# Patient Record
Sex: Female | Born: 1965
Health system: Southern US, Community
[De-identification: ages and names within clinical notes are randomized; demographics above are authoritative.]

## PROBLEM LIST (undated history)

## (undated) DIAGNOSIS — Z86718 Personal history of other venous thrombosis and embolism: Secondary | ICD-10-CM

## (undated) DIAGNOSIS — M1990 Unspecified osteoarthritis, unspecified site: Secondary | ICD-10-CM

## (undated) DIAGNOSIS — N739 Female pelvic inflammatory disease, unspecified: Secondary | ICD-10-CM

## (undated) DIAGNOSIS — G43909 Migraine, unspecified, not intractable, without status migrainosus: Secondary | ICD-10-CM

## (undated) DIAGNOSIS — F32A Depression, unspecified: Secondary | ICD-10-CM

## (undated) DIAGNOSIS — E785 Hyperlipidemia, unspecified: Secondary | ICD-10-CM

## (undated) DIAGNOSIS — F419 Anxiety disorder, unspecified: Secondary | ICD-10-CM

## (undated) DIAGNOSIS — F329 Major depressive disorder, single episode, unspecified: Secondary | ICD-10-CM

## (undated) DIAGNOSIS — B019 Varicella without complication: Secondary | ICD-10-CM

## (undated) DIAGNOSIS — K219 Gastro-esophageal reflux disease without esophagitis: Secondary | ICD-10-CM

## (undated) DIAGNOSIS — N809 Endometriosis, unspecified: Secondary | ICD-10-CM

## (undated) DIAGNOSIS — E079 Disorder of thyroid, unspecified: Secondary | ICD-10-CM

## (undated) DIAGNOSIS — D219 Benign neoplasm of connective and other soft tissue, unspecified: Secondary | ICD-10-CM

## (undated) DIAGNOSIS — S3992XA Unspecified injury of lower back, initial encounter: Secondary | ICD-10-CM

## (undated) DIAGNOSIS — N979 Female infertility, unspecified: Secondary | ICD-10-CM

## (undated) DIAGNOSIS — I10 Essential (primary) hypertension: Secondary | ICD-10-CM

## (undated) DIAGNOSIS — A64 Unspecified sexually transmitted disease: Secondary | ICD-10-CM

## (undated) DIAGNOSIS — M797 Fibromyalgia: Secondary | ICD-10-CM

## (undated) HISTORY — DX: Unspecified sexually transmitted disease: A64

## (undated) HISTORY — DX: Endometriosis, unspecified: N80.9

## (undated) HISTORY — DX: Depression, unspecified: F32.A

## (undated) HISTORY — DX: Benign neoplasm of connective and other soft tissue, unspecified: D21.9

## (undated) HISTORY — DX: Migraine, unspecified, not intractable, without status migrainosus: G43.909

## (undated) HISTORY — DX: Anxiety disorder, unspecified: F41.9

## (undated) HISTORY — DX: Major depressive disorder, single episode, unspecified: F32.9

## (undated) HISTORY — PX: LAPAROSCOPY: SHX197

## (undated) HISTORY — DX: Personal history of other venous thrombosis and embolism: Z86.718

## (undated) HISTORY — DX: Female pelvic inflammatory disease, unspecified: N73.9

## (undated) HISTORY — DX: Gastro-esophageal reflux disease without esophagitis: K21.9

## (undated) HISTORY — PX: OVARIAN CYST SURGERY: SHX726

## (undated) HISTORY — DX: Female infertility, unspecified: N97.9

## (undated) HISTORY — DX: Hyperlipidemia, unspecified: E78.5

## (undated) HISTORY — DX: Varicella without complication: B01.9

---

## 2014-08-30 ENCOUNTER — Ambulatory Visit: Payer: Self-pay | Attending: Family Medicine

## 2014-09-17 ENCOUNTER — Encounter (HOSPITAL_COMMUNITY): Payer: Self-pay | Admitting: Emergency Medicine

## 2014-09-17 ENCOUNTER — Emergency Department (INDEPENDENT_AMBULATORY_CARE_PROVIDER_SITE_OTHER)
Admission: EM | Admit: 2014-09-17 | Discharge: 2014-09-17 | Disposition: A | Discharge status: Home / Self Care | Payer: Self-pay | Source: Home / Self Care | Attending: Family Medicine | Admitting: Family Medicine

## 2014-09-17 DIAGNOSIS — E038 Other specified hypothyroidism: Secondary | ICD-10-CM

## 2014-09-17 HISTORY — DX: Essential (primary) hypertension: I10

## 2014-09-17 HISTORY — DX: Unspecified injury of lower back, initial encounter: S39.92XA

## 2014-09-17 HISTORY — DX: Disorder of thyroid, unspecified: E07.9

## 2014-09-17 MED ORDER — LEVOTHYROXINE SODIUM 112 MCG PO TABS: 112.0000 ug | ORAL_TABLET | Freq: Every day | ORAL | Status: DC

## 2014-09-17 NOTE — Discharge Instructions (Signed)
Hypothyroidism The thyroid is a large gland located in the lower front of your neck. The thyroid gland helps control metabolism. Metabolism is how your body handles food. It controls metabolism with the hormone thyroxine. When this gland is underactive (hypothyroid), it produces too little hormone.  CAUSES These include:   Absence or destruction of thyroid tissue.  Goiter due to iodine deficiency.  Goiter due to medications.  Congenital defects (since birth).  Problems with the pituitary. This causes a lack of TSH (thyroid stimulating hormone). This hormone tells the thyroid to turn out more hormone. SYMPTOMS  Lethargy (feeling as though you have no energy)  Cold intolerance  Weight gain (in spite of normal food intake)  Dry skin  Coarse hair  Menstrual irregularity (if severe, may lead to infertility)  Slowing of thought processes Cardiac problems are also caused by insufficient amounts of thyroid hormone. Hypothyroidism in the newborn is cretinism, and is an extreme form. It is important that this form be treated adequately and immediately or it will lead rapidly to retarded physical and mental development. DIAGNOSIS  To prove hypothyroidism, your caregiver may do blood tests and ultrasound tests. Sometimes the signs are hidden. It may be necessary for your caregiver to watch this illness with blood tests either before or after diagnosis and treatment. TREATMENT  Low levels of thyroid hormone are increased by using synthetic thyroid hormone. This is a safe, effective treatment. It usually takes about four weeks to gain the full effects of the medication. After you have the full effect of the medication, it will generally take another four weeks for problems to leave. Your caregiver may start you on low doses. If you have had heart problems the dose may be gradually increased. It is generally not an emergency to get rapidly to normal. HOME CARE INSTRUCTIONS   Take your  medications as your caregiver suggests. Let your caregiver know of any medications you are taking or start taking. Your caregiver will help you with dosage schedules.  As your condition improves, your dosage needs may increase. It will be necessary to have continuing blood tests as suggested by your caregiver.  Report all suspected medication side effects to your caregiver. SEEK MEDICAL CARE IF: Seek medical care if you develop:  Sweating.  Tremulousness (tremors).  Anxiety.  Rapid weight loss.  Heat intolerance.  Emotional swings.  Diarrhea.  Weakness. SEEK IMMEDIATE MEDICAL CARE IF:  You develop chest pain, an irregular heart beat (palpitations), or a rapid heart beat. MAKE SURE YOU:   Understand these instructions.  Will watch your condition.  Will get help right away if you are not doing well or get worse. Document Released: 10/29/2005 Document Revised: 01/21/2012 Document Reviewed: 06/18/2008 [employer] Patient Information 2015 [employer]. This information is not intended to replace advice given to you by your health care provider. Make sure you discuss any questions you have with your health care provider.  

## 2014-09-17 NOTE — ED Notes (Signed)
Pt states she is in process with [facility] to be set up with a PCP.  She is currently out of her thyroid medication and she is suffering from fatigue and generalized pain.  Pt wants to get a refill on her medication until she is set up with a PCP.

## 2014-09-17 NOTE — ED Provider Notes (Signed)
CSN: [id number]     Arrival date & time 09/17/14  1500 History   First MD Initiated Contact with Patient 09/17/14 1526     No chief complaint on file.  (Consider location/radiation/quality/duration/timing/severity/associated sxs/prior Treatment) HPI       48 year old female presents requesting a refill of her levothyroxine. She has been taking 112 g daily for the past 3. She is currently between primary care providers. She has not had her TSH levels checked over one year. She is having symptoms of fatigue and headaches since she ran out of her medication completely 6 days ago. She is in the process of setting up primary care provider through [facility] but she has not finished this process. She is requesting a refill to last her until she gets primary care set up.  No past medical history on file. No past surgical history on file. No family history on file. History  Substance Use Topics  . Smoking status: Not on file  . Smokeless tobacco: Not on file  . Alcohol Use: Not on file   OB History    No data available     Review of Systems  Constitutional: Positive for fatigue.  Neurological: Positive for headaches.  All other systems reviewed and are negative.   Allergies  Review of patient's allergies indicates not on file.  Home Medications   Prior to Admission medications   Medication Sig Start Date End Date Taking? Authorizing Provider  levothyroxine (SYNTHROID) 112 MCG tablet Take 1 tablet (112 mcg total) by mouth daily before breakfast. 09/17/14   Liam Graham, PA-C   BP 156/85 mmHg  Pulse 92  Temp(Src) 99.3 F (37.4 C) (Oral)  Resp 16  SpO2 98% Physical Exam  Constitutional: She is oriented to person, place, and time. Vital signs are normal. She appears well-developed and well-nourished. No distress.  HENT:  Head: Normocephalic and atraumatic.  Neck: Normal range of motion. Neck supple. No thyromegaly present.  Cardiovascular: Normal rate, regular rhythm, normal  heart sounds and intact distal pulses.   Pulmonary/Chest: Effort normal and breath sounds normal. No respiratory distress. She has no wheezes. She has no rales.  Neurological: She is alert and oriented to person, place, and time. She has normal strength. Coordination normal.  Skin: Skin is warm and dry. No rash noted. She is not diaphoretic.  Psychiatric: She has a normal mood and affect. Judgment normal.  Nursing note and vitals reviewed.   ED Course  Procedures (including critical care time) Labs Review Labs Reviewed - No data to display  Imaging Review No results found.   MDM   1. Other specified hypothyroidism    Levothyroxine refilled, #30 with 1 refill. She will follow-up with primary care. If she does not have an appointment yet, she will follow-up here in 6 weeks to get her TSH levels checked.  Meds ordered this encounter  Medications  . levothyroxine (SYNTHROID) 112 MCG tablet    Sig: Take 1 tablet (112 mcg total) by mouth daily before breakfast.    Dispense:  30 tablet    Refill:  1    Order Specific Question:  Supervising Provider    Answer:  Ihor Gully D [[id number]]       Liam Graham, PA-C 09/17/14 1534

## 2014-10-20 ENCOUNTER — Encounter: Payer: Self-pay | Admitting: Internal Medicine

## 2014-10-20 ENCOUNTER — Ambulatory Visit: Payer: Self-pay | Attending: Internal Medicine | Admitting: Internal Medicine

## 2014-10-20 VITALS — BP 171/95 | HR 85 | Temp 98.5°F | Resp 16 | Ht 60.0 in | Wt 125.0 lb

## 2014-10-20 DIAGNOSIS — I517 Cardiomegaly: Secondary | ICD-10-CM | POA: Insufficient documentation

## 2014-10-20 DIAGNOSIS — F17210 Nicotine dependence, cigarettes, uncomplicated: Secondary | ICD-10-CM | POA: Insufficient documentation

## 2014-10-20 DIAGNOSIS — Z720 Tobacco use: Secondary | ICD-10-CM

## 2014-10-20 DIAGNOSIS — F419 Anxiety disorder, unspecified: Secondary | ICD-10-CM | POA: Insufficient documentation

## 2014-10-20 DIAGNOSIS — F17200 Nicotine dependence, unspecified, uncomplicated: Secondary | ICD-10-CM

## 2014-10-20 DIAGNOSIS — Z0000 Encounter for general adult medical examination without abnormal findings: Secondary | ICD-10-CM | POA: Insufficient documentation

## 2014-10-20 DIAGNOSIS — E079 Disorder of thyroid, unspecified: Secondary | ICD-10-CM | POA: Insufficient documentation

## 2014-10-20 DIAGNOSIS — I10 Essential (primary) hypertension: Secondary | ICD-10-CM | POA: Insufficient documentation

## 2014-10-20 LAB — CBC WITH DIFFERENTIAL/PLATELET
Basophils Absolute: 0 10*3/uL (ref 0.0–0.1)
Basophils Relative: 0 % (ref 0–1)
EOS ABS: 0.1 10*3/uL (ref 0.0–0.7)
Eosinophils Relative: 1 % (ref 0–5)
HCT: 42.5 % (ref 36.0–46.0)
Hemoglobin: 14.5 g/dL (ref 12.0–15.0)
Lymphocytes Relative: 30 % (ref 12–46)
Lymphs Abs: 2.4 10*3/uL (ref 0.7–4.0)
MCH: 30 pg (ref 26.0–34.0)
MCHC: 34.1 g/dL (ref 30.0–36.0)
MCV: 87.8 fL (ref 78.0–100.0)
MONO ABS: 0.7 10*3/uL (ref 0.1–1.0)
MONOS PCT: 9 % (ref 3–12)
MPV: 12.2 fL (ref 9.4–12.4)
NEUTROS PCT: 60 % (ref 43–77)
Neutro Abs: 4.7 10*3/uL (ref 1.7–7.7)
Platelets: 221 10*3/uL (ref 150–400)
RBC: 4.84 MIL/uL (ref 3.87–5.11)
RDW: 13.6 % (ref 11.5–15.5)
WBC: 7.9 10*3/uL (ref 4.0–10.5)

## 2014-10-20 MED ORDER — LISINOPRIL 10 MG PO TABS: 10.0000 mg | ORAL_TABLET | Freq: Every day | ORAL | Status: DC

## 2014-10-20 NOTE — Progress Notes (Signed)
Pt is here to establish care. Pt has a history of HTN and thyroid disease.  Pt is requesting a physical and a pap smear. Pt states that her joints are swelling. Pt gets congested and is having allergy's. Pt has anxiety and states that sometimes it feels like her heart want to jump out of her chest.

## 2014-10-20 NOTE — Progress Notes (Signed)
Patient ID: [name], female   DOB: 01/19/1966, 48 y.o.   MRN: [id number]  MOQ:[id number]  [id number]:[id number]  DOB - [birth year]  CC:  Chief Complaint  Patient presents with  . Establish Care       HPI: Cynthia Wolf is a 48 y.o. female here today to establish medical care.  She has a past medical history of hypertension, thyroid disease, and tobacco use.  She states that she has had HTN since childhood which caused cardiomegaly. She does not remember the name of the medications that she has been on in the past.  Patient reports that she has been off her medications for several months.  She reports anxiety and joint pain that have become worse over the past few months.  She states that she often has swelling of her joints that limit her ROM.  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    She would like to have a full physical today including pap.  Her last pap and mammogram were 3 years ago and they were both normal.  She denies any breast changes, mass, tenderness, or nipple discharge.  She denies vaginal discharge, itch, or lesions.  She does not some vaginal odor.   Medications synthroid 112 mcg Surgical hx: "surgical adhesion removal of uterus" Social hx: 0.5 ppd smoker, caffeine intake (1-2 pots of coffee per day), rare etoh use, denies drug use.  Hx of chlamydia twice.    Allergies  Allergen Reactions  . Shellfish Allergy   . Yellow Dyes (Non-Tartrazine)    Past Medical History  Diagnosis Date  . Hypertension   . Thyroid disease   . Back injury    Current Outpatient Prescriptions on File Prior to Visit  Medication Sig Dispense Refill  . levothyroxine (SYNTHROID) 112 MCG tablet Take 1 tablet (112 mcg total) by mouth daily before breakfast. 30 tablet 1  . levothyroxine (SYNTHROID, LEVOTHROID) 112 MCG tablet Take 112 mcg by mouth daily before breakfast.     No current facility-administered medications on file prior to visit.   History  reviewed. No pertinent family history. History   Social History  . Marital Status: Unknown    Spouse Name: N/A    Number of Children: N/A  . Years of Education: N/A   Occupational History  . Not on file.   Social History Main Topics  . Smoking status: Current Every Day Smoker -- 0.50 packs/day  . Smokeless tobacco: Never Used  . Alcohol Use: Yes     Comment: socially  . Drug Use: No  . Sexual Activity: Not on file   Other Topics Concern  . Not on file   Social History Narrative    Review of Systems  Constitutional: Negative.   Eyes: Negative.   Respiratory: Positive for wheezing. Negative for cough, sputum production and shortness of breath.   Cardiovascular: Positive for palpitations (increased caffeine intake). Negative for chest pain, claudication and leg swelling.  Gastrointestinal: Positive for abdominal pain (pulling sensation in lower abdomen). Negative for heartburn, nausea, diarrhea and constipation.  Genitourinary: Negative.   Musculoskeletal: Negative.   Skin: Negative.   Neurological: Positive for headaches. Negative for dizziness and tingling.  Psychiatric/Behavioral: The patient is not nervous/anxious.       Objective:   Filed Vitals:   10/20/14 1507  BP: 171/95  Pulse: 85  Temp: 98.5 F (36.9 C)  Resp: 16    Physical Exam: Constitutional: Patient appears well-developed and well-nourished. No distress.  HENT: Normocephalic, atraumatic, External right and left ear normal. Oropharynx is clear and moist.  Eyes: Conjunctivae and EOM are normal. PERRLA, no scleral icterus. Neck: Normal ROM. Neck supple. No JVD. No tracheal deviation. No thyromegaly. CVS: RRR, S1/S2 +, no murmurs, no gallops, no carotid bruit.  Pulmonary: Effort and breath sounds normal, no stridor, rhonchi, wheezes, rales.  Abdominal: Soft. BS +, no distension, tenderness, rebound or guarding.  Musculoskeletal: Normal range of motion. No edema and no tenderness.  Lymphadenopathy:  No lymphadenopathy noted, cervical, Neuro: Alert. Normal reflexes, muscle tone coordination. No cranial nerve deficit. Skin: Skin is warm and dry. No rash noted. Not diaphoretic. No erythema. No pallor. Psychiatric: Normal mood and affect. Behavior, judgment, thought content normal.  Physical Exam  Pulmonary/Chest: Right breast exhibits no inverted nipple, no mass, no nipple discharge and no tenderness. Left breast exhibits no inverted nipple, no mass, no nipple discharge and no tenderness.  Genitourinary: Rectum normal, vagina normal and uterus normal. Cervix exhibits no motion tenderness, no discharge and no friability. Right adnexum displays no tenderness. Left adnexum displays no tenderness.  Lymphadenopathy:       Right: No inguinal adenopathy present.       Left: No inguinal adenopathy present.     No results found for: WBC, HGB, HCT, MCV, PLT No results found for: CREATININE, BUN, NA, K, CL, CO2  No results found for: HGBA1C Lipid Panel  No results found for: CHOL, TRIG, HDL, CHOLHDL, VLDL, LDLCALC     Assessment and plan:   Terricka was seen today for establish care.  Diagnoses and associated orders for this visit:  Annual physical exam - Cytology - PAP [place] - Cervicovaginal ancillary only - HIV antibody (with reflex) - RPR - CBC with Differential - COMPLETE METABOLIC PANEL WITH GFR - TSH - T4, Free - Lipid panel  Essential hypertension - Began lisinopril (PRINIVIL,ZESTRIL) 10 MG tablet; Take 1 tablet (10 mg total) by mouth daily.  Explained to patient diet modifications that may contribute to elevated BP. Patient will avoid foods that are high in sodium such as  canned soups and vegetables, tomato juice, commercial baked goods, commercially prepared frozen or canned entrees and sauces. Avoid salty snacks, added salt when cooking, and substituting for low sodium herbs or spices Explained exercise regimen of cardio at least three times weekly to help lower BP and  cholesterol  Smoking Smoking cessation discussed for 3 minutes, patient is not willing to quit at this time. Will continue to assess on each visit. Discussed increased risk for diseases such as cancer, heart disease, and stroke.     Return for 2-3 weeks , Nurse Visit-BP check.      Chari Manning, NP-C [facility] [phone] 10/20/2014, 3:19 PM

## 2014-10-20 NOTE — Patient Instructions (Signed)
Smoking Cessation Quitting smoking is important to your health and has many advantages. However, it is not always easy to quit since nicotine is a very addictive drug. Oftentimes, people try 3 times or more before being able to quit. This document explains the best ways for you to prepare to quit smoking. Quitting takes hard work and a lot of effort, but you can do it. ADVANTAGES OF QUITTING SMOKING  You will live longer, feel better, and live better.  Your body will feel the impact of quitting smoking almost immediately.  Within 20 minutes, blood pressure decreases. Your pulse returns to its normal level.  After 8 hours, carbon monoxide levels in the blood return to normal. Your oxygen level increases.  After 24 hours, the chance of having a heart attack starts to decrease. Your breath, hair, and body stop smelling like smoke.  After 48 hours, damaged nerve endings begin to recover. Your sense of taste and smell improve.  After 72 hours, the body is virtually free of nicotine. Your bronchial tubes relax and breathing becomes easier.  After 2 to 12 weeks, lungs can hold more air. Exercise becomes easier and circulation improves.  The risk of having a heart attack, stroke, cancer, or lung disease is greatly reduced.  After 1 year, the risk of coronary heart disease is cut in half.  After 5 years, the risk of stroke falls to the same as a nonsmoker.  After 10 years, the risk of lung cancer is cut in half and the risk of other cancers decreases significantly.  After 15 years, the risk of coronary heart disease drops, usually to the level of a nonsmoker.  If you are pregnant, quitting smoking will improve your chances of having a healthy baby.  The people you live with, especially any children, will be healthier.  You will have extra money to spend on things other than cigarettes. QUESTIONS TO THINK ABOUT BEFORE ATTEMPTING TO QUIT You may want to talk about your answers with your  health care provider.  Why do you want to quit?  If you tried to quit in the past, what helped and what did not?  What will be the most difficult situations for you after you quit? How will you plan to handle them?  Who can help you through the tough times? Your family? Friends? A health care provider?  What pleasures do you get from smoking? What ways can you still get pleasure if you quit? Here are some questions to ask your health care provider:  How can you help me to be successful at quitting?  What medicine do you think would be best for me and how should I take it?  What should I do if I need more help?  What is smoking withdrawal like? How can I get information on withdrawal? GET READY  Set a quit date.  Change your environment by getting rid of all cigarettes, ashtrays, matches, and lighters in your home, car, or work. Do not let people smoke in your home.  Review your past attempts to quit. Think about what worked and what did not. GET SUPPORT AND ENCOURAGEMENT You have a better chance of being successful if you have help. You can get support in many ways.  Tell your family, friends, and coworkers that you are going to quit and need their support. Ask them not to smoke around you.  Get individual, group, or telephone counseling and support. Programs are available at local hospitals and health centers. Call   your local health department for information about programs in your area.  Spiritual beliefs and practices may help some smokers quit.  Download a "quit meter" on your computer to keep track of quit statistics, such as how long you have gone without smoking, cigarettes not smoked, and money saved.  Get a self-help book about quitting smoking and staying off tobacco. [place] yourself from urges to smoke. Talk to someone, go for a walk, or occupy your time with a task.  Change your normal routine. Take a different route to work.  Drink tea instead of coffee. Eat breakfast in a different place.  Reduce your stress. Take a hot bath, exercise, or read a book.  Plan something enjoyable to do every day. Reward yourself for not smoking.  Explore interactive web-based programs that specialize in helping you quit. GET MEDICINE AND USE IT CORRECTLY Medicines can help you stop smoking and decrease the urge to smoke. Combining medicine with the above behavioral methods and support can greatly increase your chances of successfully quitting smoking.  Nicotine replacement therapy helps deliver nicotine to your body without the negative effects and risks of smoking. Nicotine replacement therapy includes nicotine gum, lozenges, inhalers, nasal sprays, and skin patches. Some may be available over-the-counter and others require a prescription.  Antidepressant medicine helps people abstain from smoking, but how this works is unknown. This medicine is available by prescription.  Nicotinic receptor partial agonist medicine simulates the effect of nicotine in your brain. This medicine is available by prescription. Ask your health care provider for advice about which medicines to use and how to use them based on your health history. Your health care provider will tell you what side effects to look out for if you choose to be on a medicine or therapy. Carefully read the information on the package. Do not use any other product containing nicotine while using a nicotine replacement product.  RELAPSE OR DIFFICULT SITUATIONS Most relapses occur within the first 3 months after quitting. Do not be discouraged if you start smoking again. Remember, most people try several times before finally quitting. You may have symptoms of withdrawal because your body is used to nicotine. You may crave cigarettes, be irritable, feel very hungry, cough often, get headaches, or have difficulty concentrating. The withdrawal symptoms are only temporary. They are strongest  when you first quit, but they will go away within 10-14 days. To reduce the chances of relapse, try to:  Avoid drinking alcohol. Drinking lowers your chances of successfully quitting.  Reduce the amount of caffeine you consume. Once you quit smoking, the amount of caffeine in your body increases and can give you symptoms, such as a rapid heartbeat, sweating, and anxiety.  Avoid smokers because they can make you want to smoke.  Do not let weight gain distract you. Many smokers will gain weight when they quit, usually less than 10 pounds. Eat a healthy diet and stay active. You can always lose the weight gained after you quit.  Find ways to improve your mood other than smoking. FOR MORE INFORMATION  www.smokefree.gov  Document Released: 10/23/2001 Document Revised: 03/15/2014 Document Reviewed: 02/07/2012 [facility] Patient Information 2015 [place], [place]. This information is not intended to replace advice given to you by your health care provider. Make sure you discuss any questions you have with your health care provider.  Hypothyroidism The thyroid is a large gland located in the lower front of your neck. The thyroid gland helps control metabolism.  Metabolism is how your body handles food. It controls metabolism with the hormone thyroxine. When this gland is underactive (hypothyroid), it produces too little hormone.  CAUSES These include:   Absence or destruction of thyroid tissue.  Goiter due to iodine deficiency.  Goiter due to medications.  Congenital defects (since birth).  Problems with the pituitary. This causes a lack of TSH (thyroid stimulating hormone). This hormone tells the thyroid to turn out more hormone. SYMPTOMS  Lethargy (feeling as though you have no energy)  Cold intolerance  Weight gain (in spite of normal food intake)  Dry skin  Coarse hair  Menstrual irregularity (if severe, may lead to infertility)  Slowing of thought processes Cardiac problems are  also caused by insufficient amounts of thyroid hormone. Hypothyroidism in the newborn is cretinism, and is an extreme form. It is important that this form be treated adequately and immediately or it will lead rapidly to retarded physical and mental development. DIAGNOSIS  To prove hypothyroidism, your caregiver may do blood tests and ultrasound tests. Sometimes the signs are hidden. It may be necessary for your caregiver to watch this illness with blood tests either before or after diagnosis and treatment. TREATMENT  Low levels of thyroid hormone are increased by using synthetic thyroid hormone. This is a safe, effective treatment. It usually takes about four weeks to gain the full effects of the medication. After you have the full effect of the medication, it will generally take another four weeks for problems to leave. Your caregiver may start you on low doses. If you have had heart problems the dose may be gradually increased. It is generally not an emergency to get rapidly to normal. HOME CARE INSTRUCTIONS   Take your medications as your caregiver suggests. Let your caregiver know of any medications you are taking or start taking. Your caregiver will help you with dosage schedules.  As your condition improves, your dosage needs may increase. It will be necessary to have continuing blood tests as suggested by your caregiver.  Report all suspected medication side effects to your caregiver. SEEK MEDICAL CARE IF: Seek medical care if you develop:  Sweating.  Tremulousness (tremors).  Anxiety.  Rapid weight loss.  Heat intolerance.  Emotional swings.  Diarrhea.  Weakness. SEEK IMMEDIATE MEDICAL CARE IF:  You develop chest pain, an irregular heart beat (palpitations), or a rapid heart beat. MAKE SURE YOU:   Understand these instructions.  Will watch your condition.  Will get help right away if you are not doing well or get worse. Document Released: 10/29/2005 Document Revised:  01/21/2012 Document Reviewed: 06/18/2008 [facility] Patient Information 2015 [place], [place]. This information is not intended to replace advice given to you by your health care provider. Make sure you discuss any questions you have with your health care provider.

## 2014-10-21 LAB — CYTOLOGY - PAP

## 2014-10-21 LAB — COMPLETE METABOLIC PANEL WITH GFR
ALT: 12 U/L (ref 0–35)
AST: 15 U/L (ref 0–37)
Albumin: 4.4 g/dL (ref 3.5–5.2)
Alkaline Phosphatase: 53 U/L (ref 39–117)
BILIRUBIN TOTAL: 0.6 mg/dL (ref 0.2–1.2)
BUN: 8 mg/dL (ref 6–23)
CALCIUM: 9.4 mg/dL (ref 8.4–10.5)
CO2: 24 mEq/L (ref 19–32)
CREATININE: 0.59 mg/dL (ref 0.50–1.10)
Chloride: 103 mEq/L (ref 96–112)
GFR, Est African American: >89 mL/min
GFR, Est Non African American: >89 mL/min
Glucose, Bld: 84 mg/dL (ref 70–99)
Potassium: 3.9 mEq/L (ref 3.5–5.3)
Sodium: 140 mEq/L (ref 135–145)
Total Protein: 7.7 g/dL (ref 6.0–8.3)

## 2014-10-21 LAB — CERVICOVAGINAL ANCILLARY ONLY
Chlamydia: NEGATIVE
Neisseria Gonorrhea: NEGATIVE
WET PREP (BD AFFIRM): NEGATIVE
Wet Prep (BD Affirm): NEGATIVE
Wet Prep (BD Affirm): POSITIVE — AB

## 2014-10-21 LAB — LIPID PANEL
CHOL/HDL RATIO: 3.1 ratio
Cholesterol: 207 mg/dL — ABNORMAL HIGH (ref 0–200)
HDL: 66 mg/dL (ref >39)
LDL CALC: 120 mg/dL — AB (ref 0–99)
Triglycerides: 104 mg/dL (ref <150)
VLDL: 21 mg/dL (ref 0–40)

## 2014-10-21 LAB — HIV ANTIBODY (ROUTINE TESTING W REFLEX): HIV: NONREACTIVE

## 2014-10-21 LAB — T4, FREE: Free T4: 1.46 ng/dL (ref 0.80–1.80)

## 2014-10-21 LAB — TSH: TSH: 1.195 u[IU]/mL (ref 0.350–4.500)

## 2014-10-21 LAB — RPR

## 2014-10-22 ENCOUNTER — Telehealth: Payer: Self-pay | Admitting: Emergency Medicine

## 2014-10-22 MED ORDER — METRONIDAZOLE 500 MG PO TABS: 500.0000 mg | ORAL_TABLET | Freq: Two times a day (BID) | ORAL | Status: DC

## 2014-10-22 MED ORDER — LEVOTHYROXINE SODIUM 112 MCG PO TABS: 112.0000 ug | ORAL_TABLET | Freq: Every day | ORAL | Status: DC

## 2014-10-22 NOTE — Telephone Encounter (Signed)
-----   Message from Lance Bosch, NP sent at 10/21/2014  6:19 PM EST ----- Labs are normal with the exception of Cholesterol slightly elevated. Please provide appropriate education regarding diet and exercise.   TSH is WNL, please refill current dose of Synthroid.

## 2014-10-22 NOTE — Telephone Encounter (Signed)
2nd attempt to reach pt with pap smear results Medication e-scribed to [facility]

## 2014-10-22 NOTE — Telephone Encounter (Signed)
Left message for pt to call for lab results 

## 2014-10-25 ENCOUNTER — Telehealth: Payer: Self-pay | Admitting: *Deleted

## 2014-10-25 NOTE — Telephone Encounter (Signed)
-----   Message from Lance Bosch, NP sent at 10/21/2014  6:19 PM EST ----- Labs are normal with the exception of Cholesterol slightly elevated. Please provide appropriate education regarding diet and exercise.   TSH is WNL, please refill current dose of Synthroid.

## 2014-10-25 NOTE — Telephone Encounter (Signed)
Patient notified of lab results and instructions Rx previously e-scribed to [facility]

## 2014-10-27 ENCOUNTER — Telehealth: Payer: Self-pay | Admitting: Emergency Medicine

## 2014-10-27 NOTE — Telephone Encounter (Signed)
Left message with negative pap smear results

## 2014-10-27 NOTE — Telephone Encounter (Signed)
-----   Message from Lance Bosch, NP sent at 10/26/2014  1:14 PM EST ----- Pap negative for malignancies

## 2015-04-01 ENCOUNTER — Ambulatory Visit: Payer: Self-pay | Attending: Internal Medicine | Admitting: Family Medicine

## 2015-04-01 VITALS — BP 144/89 | HR 94 | Wt 130.8 lb

## 2015-04-01 DIAGNOSIS — M791 Myalgia, unspecified site: Secondary | ICD-10-CM

## 2015-04-01 DIAGNOSIS — R5383 Other fatigue: Secondary | ICD-10-CM

## 2015-04-01 NOTE — Progress Notes (Signed)
Subjective:     Patient ID: Cynthia Wolf, female   DOB: 1966-09-26, 49 y.o.   MRN: [id number]  HPI   Patient presents today with a complaint of her facial muscles on the right feeling like they are pulling. She has also experienced occassional short episodes of light-headedness upon rising from lying or sitting. This last for a few seconds. Her other complaint is of fatigue for a few days. She has a history of HTN, Hypothyroidism, Fibromyalgia and osetoarthritis. Her meds can be seen in her med list. She denies needing refills. She reports taking her medications regularly. She has recently been in [place] for a couple of months and got a couple of months refills there. She is on some medications for arthritis and fibro but she did not bring those and cannot tell be what they are.   Review of Systems  She denies facial pain, problems with speech or swallowing, Denies any unilateral weakness Denies SOB or chest pain.She denies severe headaches.      Objective:   Physical Exam   She is alert, oriented, appropriate in no distress. She appears well-nourished and well-developed.  Pupils are equal, EOMS intact, There is no facial droop. Smile is even and tongue movement normal. The rest of cranial nerves are intact. Gait is normal. Grips are equal but a little weak due to her OA. Neck is supple FROM w/o adenopathy or tenderness Lungs are clear Heart Sounds are regular. BP 144/89     Assessment:     Abnormal feeling in face on right Fatigue      Plan:     I have explained that I see nothing to indicate a stroke, Bells's Palsey or Trig Neuralgia.   I have made her promise to go to ED immediately for any additional symptoms and spelled out those instruction in her AVS. I suggested labs to see if this suggest any reason for these symptoms or her fatigue but she declines to have done. I have suggested she get her [facility] Card renewed so we can ordered further assessment if  needed She should follo-up here if symptoms do not resolve   Micheline Chapman, FNP-BC

## 2015-04-01 NOTE — Patient Instructions (Signed)
I am not sure why your face feels like it does. You have nothing in your exam to suggest a stroke or TIA If you should have any worsening of symptoms or new symptoms such as weakness on one side of body, difficulty with speech or swallowing. Inability to walk, go to the ED immediately. We are doing bloodwork to see if we find a reason for fatigue. Follow-up with financial people about getting your [facility] card renewed.

## 2015-08-04 ENCOUNTER — Encounter (HOSPITAL_BASED_OUTPATIENT_CLINIC_OR_DEPARTMENT_OTHER): Payer: Self-pay

## 2015-08-04 ENCOUNTER — Emergency Department (HOSPITAL_BASED_OUTPATIENT_CLINIC_OR_DEPARTMENT_OTHER)
Admission: EM | Admit: 2015-08-04 | Discharge: 2015-08-04 | Disposition: A | Discharge status: Home / Self Care | Payer: Self-pay | Attending: Emergency Medicine | Admitting: Emergency Medicine

## 2015-08-04 DIAGNOSIS — E079 Disorder of thyroid, unspecified: Secondary | ICD-10-CM | POA: Insufficient documentation

## 2015-08-04 DIAGNOSIS — I10 Essential (primary) hypertension: Secondary | ICD-10-CM | POA: Insufficient documentation

## 2015-08-04 DIAGNOSIS — Z720 Tobacco use: Secondary | ICD-10-CM | POA: Insufficient documentation

## 2015-08-04 DIAGNOSIS — M545 Low back pain, unspecified: Secondary | ICD-10-CM

## 2015-08-04 DIAGNOSIS — Z87828 Personal history of other (healed) physical injury and trauma: Secondary | ICD-10-CM | POA: Insufficient documentation

## 2015-08-04 HISTORY — DX: Fibromyalgia: M79.7

## 2015-08-04 HISTORY — DX: Unspecified osteoarthritis, unspecified site: M19.90

## 2015-08-04 LAB — URINALYSIS, ROUTINE W REFLEX MICROSCOPIC
BILIRUBIN URINE: NEGATIVE
GLUCOSE, UA: NEGATIVE mg/dL
KETONES UR: NEGATIVE mg/dL
LEUKOCYTES UA: NEGATIVE
NITRITE: NEGATIVE
Protein, ur: NEGATIVE mg/dL
SPECIFIC GRAVITY, URINE: 1.002 — AB (ref 1.005–1.030)
UROBILINOGEN UA: 0.2 mg/dL (ref 0.0–1.0)
pH: 6 (ref 5.0–8.0)

## 2015-08-04 LAB — URINE MICROSCOPIC-ADD ON

## 2015-08-04 MED ORDER — NAPROXEN 500 MG PO TABS: 500.0000 mg | ORAL_TABLET | Freq: Two times a day (BID) | ORAL | Status: DC

## 2015-08-04 MED ORDER — DIAZEPAM 5 MG PO TABS
5.0000 mg | ORAL_TABLET | Freq: Once | ORAL | Status: AC
  Administered 2015-08-04: 5 mg via ORAL
  Filled 2015-08-04: qty 1

## 2015-08-04 MED ORDER — METAXALONE 800 MG PO TABS: 800.0000 mg | ORAL_TABLET | Freq: Three times a day (TID) | ORAL | Status: DC

## 2015-08-04 MED ORDER — OXYCODONE-ACETAMINOPHEN 5-325 MG PO TABS
1.0000 | ORAL_TABLET | Freq: Once | ORAL | Status: AC
  Administered 2015-08-04: 1 via ORAL
  Filled 2015-08-04: qty 1

## 2015-08-04 NOTE — Discharge Instructions (Signed)
Return to the ED with any concerns including weakness of legs, not able to urinate, loss of control of bowel or bladder, fever/chills, decreased level of alertness/lethargy, or any other alarming symptoms °

## 2015-08-04 NOTE — ED Provider Notes (Signed)
CSN: [id number]     Arrival date & time 08/04/15  4431 History   First MD Initiated Contact with Patient 08/04/15 0900     Chief Complaint  Patient presents with  . Back Pain     (Consider location/radiation/quality/duration/timing/severity/associated sxs/prior Treatment) HPI  Pt presenting with right sided low back pain.  No weakness of legs, no incontinence of bowel or bladder, no urinary retention.  No specific injury.  No fever/chills.   Pain began several days ago in the morning.  She states pain radiates down right thigh and to right calf.  No swelling of legs.  Pt is able to ambulate normally.  She has been taking alleve.  There are no other associated systemic symptoms, there are no other alleviating or modifying factors.   Past Medical History  Diagnosis Date  . Hypertension   . Thyroid disease   . Back injury   . Fibromyalgia   . Osteoarthritis    Past Surgical History  Procedure Laterality Date  . Ovarian cyst surgery     No family history on file. Social History  Substance Use Topics  . Smoking status: Current Every Day Smoker -- 0.50 packs/day    Types: Cigarettes  . Smokeless tobacco: Never Used  . Alcohol Use: Yes     Comment: socially   OB History    No data available     Review of Systems  ROS reviewed and all otherwise negative except for mentioned in HPI    Allergies  Shellfish allergy and Yellow dyes (non-tartrazine)  Home Medications   Prior to Admission medications   Medication Sig Start Date End Date Taking? Authorizing Provider  levothyroxine (SYNTHROID, LEVOTHROID) 112 MCG tablet Take 1 tablet (112 mcg total) by mouth daily before breakfast. 10/22/14   Lance Bosch, NP  lisinopril (PRINIVIL,ZESTRIL) 10 MG tablet Take 1 tablet (10 mg total) by mouth daily. 10/20/14   Lance Bosch, NP  metaxalone (SKELAXIN) 800 MG tablet Take 1 tablet (800 mg total) by mouth 3 (three) times daily. 08/04/15   Alfonzo Beers, MD  metroNIDAZOLE (FLAGYL) 500 MG  tablet Take 1 tablet (500 mg total) by mouth 2 (two) times daily. Patient not taking: Reported on 04/01/2015 10/22/14   Lance Bosch, NP  naproxen (NAPROSYN) 500 MG tablet Take 1 tablet (500 mg total) by mouth 2 (two) times daily. 08/04/15   Alfonzo Beers, MD   BP 132/71 mmHg  Pulse 71  Temp(Src) 98.2 F (36.8 C) (Oral)  Resp 16  Ht 5' (1.524 m)  SpO2 98%  LMP 07/11/2015  Vitals reviewed Physical Exam  Physical Examination: General appearance - alert, well appearing, and in no distress Mental status - alert, oriented to person, place, and time Eyes - no scleral icterus, no conjunctival injection Neck - supple, no significant adenopathy Chest - clear to auscultation, no wheezes, rales or rhonchi, symmetric air entry Heart - normal rate, regular rhythm, normal S1, S2, no murmurs, rubs, clicks or gallops Abdomen - soft, nontender, nondistended, no masses or organomegaly Back exam - no midline tenderness of c/t/l spine, right sided paraspinal muscle tenderness, no CVA tenderness Neurological - alert, oriented, normal speech, normal gait, strength 5/5 in extremities x 4, sensation intact Extremities - peripheral pulses normal, no pedal edema, no clubbing or cyanosis Skin - normal coloration and turgor, no rashes  ED Course  Procedures (including critical care time) Labs Review Labs Reviewed  URINALYSIS, ROUTINE W REFLEX MICROSCOPIC (NOT AT [facility]) - Abnormal; Notable for the  following:    Specific Gravity, Urine 1.002 (*)    Hgb urine dipstick SMALL (*)    All other components within normal limits  URINE MICROSCOPIC-ADD ON - Abnormal; Notable for the following:    Bacteria, UA FEW (*)    All other components within normal limits    Imaging Review No results found. I have personally reviewed and evaluated these images and lab results as part of my medical decision-making.   EKG Interpretation None      MDM   Final diagnoses:  Right-sided low back pain without sciatica     Pt presenting with c/o right sided low back pain.  No signs or symptoms of cauda equina, no fever to suggest epidural abscess.  Pt treated with meds in the ED, discharged with anti-inflammatory and muscle relaxer.  Discharged with strict return precautions.  Pt agreeable with plan.    Alfonzo Beers, MD 08/04/15 1325

## 2015-08-04 NOTE — ED Notes (Addendum)
Pain started Sunday while sleeping. Pain in lower back and right leg down to calf. Pt states no falls. Pt has hx of fibromyalgia and osteoarthritis. Pt ambulated to room.

## 2015-08-04 NOTE — ED Notes (Signed)
Pt c/o of lower back pain that radiates down into R leg down to calf. Pt states she feels pressure and cold.

## 2015-12-02 ENCOUNTER — Encounter: Payer: Self-pay | Admitting: Certified Nurse Midwife

## 2015-12-02 ENCOUNTER — Ambulatory Visit (INDEPENDENT_AMBULATORY_CARE_PROVIDER_SITE_OTHER): Payer: BLUE CROSS/BLUE SHIELD | Admitting: Certified Nurse Midwife

## 2015-12-02 VITALS — BP 140/88 | HR 78 | Resp 18 | Ht 61.25 in | Wt 148.0 lb

## 2015-12-02 DIAGNOSIS — Z124 Encounter for screening for malignant neoplasm of cervix: Secondary | ICD-10-CM | POA: Diagnosis not present

## 2015-12-02 DIAGNOSIS — E039 Hypothyroidism, unspecified: Secondary | ICD-10-CM

## 2015-12-02 DIAGNOSIS — Z01419 Encounter for gynecological examination (general) (routine) without abnormal findings: Secondary | ICD-10-CM | POA: Diagnosis not present

## 2015-12-02 DIAGNOSIS — Z0000 Encounter for general adult medical examination without abnormal findings: Secondary | ICD-10-CM

## 2015-12-02 DIAGNOSIS — I10 Essential (primary) hypertension: Secondary | ICD-10-CM

## 2015-12-02 DIAGNOSIS — N912 Amenorrhea, unspecified: Secondary | ICD-10-CM | POA: Diagnosis not present

## 2015-12-02 LAB — LIPID PANEL
CHOL/HDL RATIO: 2.7 ratio (ref <=5.0)
CHOLESTEROL: 234 mg/dL — AB (ref 125–200)
HDL: 86 mg/dL (ref >=46)
LDL Cholesterol: 129 mg/dL (ref <130)
Triglycerides: 95 mg/dL (ref <150)
VLDL: 19 mg/dL (ref <30)

## 2015-12-02 LAB — COMPREHENSIVE METABOLIC PANEL
ALBUMIN: 4.5 g/dL (ref 3.6–5.1)
ALK PHOS: 64 U/L (ref 33–115)
ALT: 16 U/L (ref 6–29)
AST: 20 U/L (ref 10–35)
BILIRUBIN TOTAL: 0.5 mg/dL (ref 0.2–1.2)
BUN: 7 mg/dL (ref 7–25)
CALCIUM: 9.2 mg/dL (ref 8.6–10.2)
CO2: 24 mmol/L (ref 20–31)
Chloride: 103 mmol/L (ref 98–110)
Creat: 0.66 mg/dL (ref 0.50–1.10)
Glucose, Bld: 84 mg/dL (ref 65–99)
POTASSIUM: 3.8 mmol/L (ref 3.5–5.3)
Sodium: 139 mmol/L (ref 135–146)
Total Protein: 7.8 g/dL (ref 6.1–8.1)

## 2015-12-02 LAB — TSH: TSH: 3.596 u[IU]/mL (ref 0.350–4.500)

## 2015-12-02 LAB — POCT URINE PREGNANCY: Preg Test, Ur: NEGATIVE

## 2015-12-02 MED ORDER — LISINOPRIL 10 MG PO TABS: 10.0000 mg | ORAL_TABLET | Freq: Every day | ORAL | Status: DC

## 2015-12-02 MED ORDER — LEVOTHYROXINE SODIUM 112 MCG PO TABS: 112.0000 ug | ORAL_TABLET | Freq: Every day | ORAL | Status: DC

## 2015-12-02 NOTE — Patient Instructions (Signed)

## 2015-12-02 NOTE — Progress Notes (Signed)
50 yo white married [id number] here to establish gyn care and for aex. Patient recently moved from [place] for spouse'job and last aex was one year ago. Periods have been irregular in the past 5-6 years due to hypothyroid. Patient describes periods as sporadic, spotting or regular bleeding. LMP 10/02/15 and has not had any bleeding or spotting since 10/02/15. Patient had hemorrhage in 1996 due to ? Irregular periods. Patient was seeing PCP for medication management of Hypertension/Fibromyalgia/ Hypothyroid. Has not found PCP who she can enroll yet and is down to one dose of all medications. BP has been elevated because she has been taking limited dose to make last. Contraception none, her choice. History of chlamydia from first marriage and was treated, developed PID. Patient is a daily smoker working on cessation. Patient is happy with finally "someone seeing her today".  No other health issues today.  Patient's last menstrual period was 10/02/2015.          Sexually active: Yes.    The current method of family planning is none.    Exercising: No.  exercise Smoker:  yes  Health Maintenance: Pap:  2016 neg per patient MMG:  2015 neg per patient Colonoscopy:  none BMD:   none TDaP:  2014 Shingles: no Pneumonia: no Hep C and HIV: Hep C not done, HIV done neg 2013 Labs: upt-neg Self breast exam: done occ   reports that she has been smoking Cigarettes.  She has been smoking about 0.50 packs per day. She has never used smokeless tobacco. She reports that she drinks about 0.6 - 1.2 oz of alcohol per week. She reports that she does not use illicit drugs.  Past Medical History  Diagnosis Date  . Hypertension   . Thyroid disease   . Back injury   . Fibromyalgia   . Osteoarthritis   . Endometriosis   . Anxiety   . Depression   . H/O blood clots     from heavy menstrual cycles. Had shot done to stop bleeding per patient  . STD (sexually transmitted disease)     chlamydia  . Fibroid     ?   Marland Kitchen PID (pelvic inflammatory disease)   . Infertility, female   . Migraines     Past Surgical History  Procedure Laterality Date  . Ovarian cyst surgery    . Laparoscopy      Current Outpatient Prescriptions  Medication Sig Dispense Refill  . DULoxetine (CYMBALTA) 60 MG capsule Take 60 mg by mouth daily.    Marland Kitchen lisinopril (PRINIVIL,ZESTRIL) 10 MG tablet Take 1 tablet (10 mg total) by mouth daily. 30 tablet 3  . meloxicam (MOBIC) 7.5 MG tablet Take 7.5 mg by mouth daily.    . metaxalone (SKELAXIN) 800 MG tablet Take 1 tablet (800 mg total) by mouth 3 (three) times daily. 21 tablet 0  . naproxen (NAPROSYN) 500 MG tablet Take 1 tablet (500 mg total) by mouth 2 (two) times daily. 30 tablet 0  . levothyroxine (SYNTHROID, LEVOTHROID) 112 MCG tablet Take 1 tablet (112 mcg total) by mouth daily before breakfast. (Patient not taking: Reported on 12/02/2015) 30 tablet 2   No current facility-administered medications for this visit.    Family History  Problem Relation Age of Onset  . Breast cancer Maternal Aunt   . Other Maternal Uncle     tumor  . Leukemia Paternal Uncle   . Cancer Maternal Grandfather     ROS:  Pertinent items are noted in HPI.  Otherwise,  a comprehensive ROS was negative.  Exam:   BP 160/90 mmHg  Pulse 72  Resp 16  Ht 5' 1.25" (1.556 m)  Wt 148 lb (67.132 kg)  BMI 27.73 kg/m2  LMP 10/02/2015 Height: 5' 1.25" (155.6 cm) Ht Readings from Last 3 Encounters:  12/02/15 5' 1.25" (1.556 m)  08/04/15 5' (1.524 m)  10/20/14 5' (1.524 m)    General appearance: alert, cooperative and appears stated age Head: Normocephalic, without obvious abnormality, atraumatic Neck: no adenopathy, supple, symmetrical, trachea midline and thyroid normal to inspection and palpation Lungs: clear to auscultation bilaterally Breasts: normal appearance, no masses or tenderness, No nipple retraction or dimpling, No nipple discharge or bleeding, No axillary or supraclavicular  adenopathy Heart: regular rate and rhythm Abdomen: soft, non-tender; no masses,  no organomegaly Extremities: extremities normal, atraumatic, no cyanosis or edema Skin: Skin color, texture, turgor normal. No rashes or lesions Lymph nodes: Cervical, supraclavicular, and axillary nodes normal. No abnormal inguinal nodes palpated Neurologic: Grossly normal   Pelvic: External genitalia:  no lesions              Urethra:  normal appearing urethra with no masses, tenderness or lesions              Bartholin's and Skene's: normal                 Vagina: normal appearing vagina with normal color and discharge, no lesions              Cervix: normal,non tender, no leisons              Pap taken: Yes.   Bimanual Exam:  Uterus:  normal size, contour, position, consistency, mobility, non-tender              Adnexa: normal adnexa and no mass, fullness, tenderness               Rectovaginal: Confirms               Anus:  normal sphincter tone, no lesions  Chaperone present: yes  A:  Well Woman with normal exam  Contraception none POCT UPT neg.  Perimenopausal symptoms with amenorrhea( long history of irregular periods per patient)  Screening labs  Mammogram due  Hypertension controlled on medication, out of med, new to area, no PCP yet  Hypothyroid controlled on medication, out of med.  Fibromyalgia uses Cymbalta with good relief out of med.  P:   Reviewed health and wellness pertinent to exam  Discussed condom use for contraception, patient will consider. Discussed if no period by February will need to advise and may need treat and would unable to do this if no contraception and possible pregnancy.   Discussed perimenopausal/menopause, etiology and expectations with cycle changes and symptoms. Also discussed thyroid can influence cycle changes if not stable. Recommend Labs to assess status. Patient agreeable.  Lab: TSH,FSH,Prolactin  Labs; CMP,Lipid panel, Hep.C, Vitamin D  Given information  to schedule mammogram, patient will schedule/  Discussed due to the possible serious health concerns with no Hypertensive medication with BP elevation and with Hypothyroid, will give patient 30 days of medication until can establish PCP. Patient requested assistance with referral into PCP. Patient will be called with information.  Rx Lisinpril see order  Rx Synthroid see order Patient feels she can manage without the other medications until sees PCP.  Pap smear as above with  HPVHR   counseled on breast self exam, mammography screening, adequate intake of  calcium and vitamin D, diet and exercise  return annually or prn  An After Visit Summary was printed and given to the patient.

## 2015-12-03 LAB — VITAMIN D 25 HYDROXY (VIT D DEFICIENCY, FRACTURES): Vit D, 25-Hydroxy: 15 ng/mL — ABNORMAL LOW (ref 30–100)

## 2015-12-03 LAB — HEPATITIS C ANTIBODY: HCV Ab: NEGATIVE

## 2015-12-03 LAB — FOLLICLE STIMULATING HORMONE: FSH: 79.2 m[IU]/mL

## 2015-12-03 LAB — PROLACTIN: Prolactin: 6 ng/mL

## 2015-12-05 NOTE — Progress Notes (Signed)
Reviewed personally.  M. Suzanne Omaira Mellen, MD.  

## 2015-12-06 LAB — IPS PAP TEST WITH HPV

## 2015-12-07 ENCOUNTER — Other Ambulatory Visit: Payer: Self-pay

## 2015-12-07 ENCOUNTER — Telehealth: Payer: Self-pay

## 2015-12-07 DIAGNOSIS — E559 Vitamin D deficiency, unspecified: Secondary | ICD-10-CM

## 2015-12-07 DIAGNOSIS — N760 Acute vaginitis: Secondary | ICD-10-CM

## 2015-12-07 MED ORDER — METRONIDAZOLE 0.75 % VA GEL: 1.0000 | Freq: Every day | VAGINAL | Status: DC

## 2015-12-07 NOTE — Telephone Encounter (Signed)
-----   Message from Regina Eck, CNM sent at 12/07/2015  7:57 AM EST ----- Pap smear negative with  HPVHR not detected 02 Pap showed BV please call patient and check it symptomatic and advise if medication needed.

## 2015-12-07 NOTE — Telephone Encounter (Signed)
Returning a call to Joy. °

## 2015-12-07 NOTE — Telephone Encounter (Signed)
Which number is the updated number?

## 2015-12-07 NOTE — Telephone Encounter (Signed)
Patient's spouse, Jernee Kisling (DPR on file to share PHI), called and said to call the patient back at the updated number.

## 2015-12-07 NOTE — Telephone Encounter (Signed)
Patient notified of results. See lab 

## 2015-12-07 NOTE — Telephone Encounter (Signed)
lmtcb

## 2016-01-02 ENCOUNTER — Ambulatory Visit (INDEPENDENT_AMBULATORY_CARE_PROVIDER_SITE_OTHER): Payer: BLUE CROSS/BLUE SHIELD | Admitting: Family

## 2016-01-02 ENCOUNTER — Encounter: Payer: Self-pay | Admitting: Family

## 2016-01-02 VITALS — BP 182/102 | HR 75 | Temp 97.8°F | Resp 16 | Ht 61.25 in | Wt 152.0 lb

## 2016-01-02 DIAGNOSIS — E039 Hypothyroidism, unspecified: Secondary | ICD-10-CM | POA: Diagnosis not present

## 2016-01-02 DIAGNOSIS — F418 Other specified anxiety disorders: Secondary | ICD-10-CM

## 2016-01-02 DIAGNOSIS — F419 Anxiety disorder, unspecified: Secondary | ICD-10-CM

## 2016-01-02 DIAGNOSIS — F32A Depression, unspecified: Secondary | ICD-10-CM

## 2016-01-02 DIAGNOSIS — I10 Essential (primary) hypertension: Secondary | ICD-10-CM | POA: Diagnosis not present

## 2016-01-02 DIAGNOSIS — F329 Major depressive disorder, single episode, unspecified: Secondary | ICD-10-CM

## 2016-01-02 DIAGNOSIS — M797 Fibromyalgia: Secondary | ICD-10-CM

## 2016-01-02 MED ORDER — DULOXETINE HCL 60 MG PO CPEP: 60.0000 mg | ORAL_CAPSULE | Freq: Every day | ORAL | Status: DC

## 2016-01-02 MED ORDER — LEVOTHYROXINE SODIUM 125 MCG PO TABS: 125.0000 ug | ORAL_TABLET | Freq: Every day | ORAL | Status: DC

## 2016-01-02 MED ORDER — METAXALONE 800 MG PO TABS: 800.0000 mg | ORAL_TABLET | Freq: Three times a day (TID) | ORAL | Status: DC

## 2016-01-02 NOTE — Assessment & Plan Note (Signed)
Anxiety and depression appear adequately controlled with current regimen. Denies suicidal ideations. Continue current dosage of duloxetine. Follow-up in 3 months.

## 2016-01-02 NOTE — Patient Instructions (Signed)
Thank you for choosing [facility].  Summary/Instructions:  Your prescription(s) have been submitted to your pharmacy or been printed and provided for you. Please take as directed and contact our office if you believe you are having problem(s) with the medication(s) or have any questions.  If your symptoms worsen or fail to improve, please contact our office for further instruction, or in case of emergency go directly to the emergency room at the closest medical facility.   Please continue to take your medications as prescribed.   A referral to rheumatology has been placed for the osteoarthritis.  Please start taking the lisinopril and monitoring your blood pressure at home. Follow up via phone or MyChart if we need to change medications.

## 2016-01-02 NOTE — Assessment & Plan Note (Signed)
Currently maintained on metaxalone and duloxetine. Denies adverse side effects. Continues to experience pain in a variety of muscle/joints. Advised ice and physical activity to help with her symptoms. Refer to rheumatology for osteoarthritis purposes. Follow-up in 3 months or sooner if needed.

## 2016-01-02 NOTE — Assessment & Plan Note (Signed)
Previously prescribed lisinopril with the adverse side effect of making her feel "different". Discussed importance of blood pressure medication and reducing risk for end organ damage in the future with goal of blood pressure less than 140/90. Following discussion she wishes to retry the lisinopril. If this is unsuccessful, recommend starting hydrochlorothiazide or chlorthalidone. Monitor blood pressure at home. Follow-up in 3 weeks.

## 2016-01-02 NOTE — Progress Notes (Signed)
Pre visit review using our clinic review tool, if applicable. No additional management support is needed unless otherwise documented below in the visit note. 

## 2016-01-02 NOTE — Progress Notes (Signed)
Subjective:    Patient ID: Cynthia Wolf, female    DOB: 24-Dec-1965, 50 y.o.   MRN: [id number]  Chief Complaint  Patient presents with  . Establish Care    was diagnosed with osteoarthritis and fibromyalgia, has problems with her throat, has some numbness left hand and swelling, does not know if this all has to do with fibromyalgia    HPI:  Cynthia Wolf is a 50 y.o. female who  has a past medical history of Hypertension; Thyroid disease; Back injury; Fibromyalgia; Osteoarthritis; Endometriosis; Anxiety; Depression; H/O blood clots; STD (sexually transmitted disease); Fibroid; PID (pelvic inflammatory disease); Infertility, female; Migraines; Chicken pox; GERD (gastroesophageal reflux disease); and Hyperlipidemia. and presents today for an office visit to establish care.  1.) Fibromyalgia - Diagnosed with osteoarthritis and fibromyalgia by a rheumatoligist in [place]. She is currently experiencing the associated symptom of numbness and swelling located in her left forearm specifically that has been going on for about 1 day. Severity of the symptoms if 0/10 with just a swelling feeling. Does have some weakness in her hands and arms. Modifying factors include metaxalone, naproxen and duloxetine. The medications help a little with her symptoms. Continues to experience fatigue. Averaging minimal sleep secondary to pain in her joints and muscles waking about every 2 hours.   2.) Hypertension - Currently maintained lisinopril. Indicates that she does not currently take the medication because she did not like the way it made her feel. Denies symptoms of end organ damage. Does not currently take her blood pressure at home. Last eye exam toward the end of 2016.   3.) Depression/Anxiety - Currently maintained on duloxetine. Reports the taking the medication as prescribed and denies adverse effects or thoughts of suicide. Expresses that she still continues to do a lot of crying.  4.)  Hypothyroidism - Currently maintained on levothyroxine. Reports taking the medication as prescribed and denies adverse side effects. She does report fatigue and some hair loss.    Lab Results  Component Value Date   TSH 3.596 12/02/2015     Allergies  Allergen Reactions  . Shellfish Allergy   . Yellow Dyes (Non-Tartrazine)      Outpatient Prescriptions Prior to Visit  Medication Sig Dispense Refill  . meloxicam (MOBIC) 7.5 MG tablet Take 7.5 mg by mouth daily.    . metroNIDAZOLE (METROGEL) 0.75 % vaginal gel Place 1 Applicatorful vaginally at bedtime. Use for 5 days 70 g 0  . DULoxetine (CYMBALTA) 60 MG capsule Take 60 mg by mouth daily.    Marland Kitchen levothyroxine (SYNTHROID, LEVOTHROID) 112 MCG tablet Take 1 tablet (112 mcg total) by mouth daily before breakfast. 30 tablet 0  . lisinopril (PRINIVIL,ZESTRIL) 10 MG tablet Take 1 tablet (10 mg total) by mouth daily. 30 tablet 0  . metaxalone (SKELAXIN) 800 MG tablet Take 1 tablet (800 mg total) by mouth 3 (three) times daily. 21 tablet 0  . naproxen (NAPROSYN) 500 MG tablet Take 1 tablet (500 mg total) by mouth 2 (two) times daily. 30 tablet 0   No facility-administered medications prior to visit.     Past Medical History  Diagnosis Date  . Hypertension   . Thyroid disease   . Back injury   . Fibromyalgia   . Osteoarthritis   . Endometriosis   . Anxiety   . Depression   . H/O blood clots     from heavy menstrual cycles. Had shot done to stop bleeding per patient  . STD (sexually transmitted disease)  chlamydia  . Fibroid     ?  Marland Kitchen PID (pelvic inflammatory disease)   . Infertility, female   . Migraines   . Chicken pox   . GERD (gastroesophageal reflux disease)   . Hyperlipidemia      Past Surgical History  Procedure Laterality Date  . Ovarian cyst surgery    . Laparoscopy       Family History  Problem Relation Age of Onset  . Breast cancer Maternal Aunt   . Other Maternal Uncle     tumor  . Leukemia Paternal  Uncle   . Cancer Maternal Grandfather   . Arthritis Mother   . Hyperlipidemia Mother   . Stroke Mother   . Hypertension Mother   . Diabetes Mother   . Hyperlipidemia Father      Social History   Social History  . Marital Status: Married    Spouse Name: N/A  . Number of Children: 1  . Years of Education: 12   Occupational History  . [employer] Associate    Social History Main Topics  . Smoking status: Current Every Day Smoker -- 0.50 packs/day for 37 years    Types: Cigarettes  . Smokeless tobacco: Never Used  . Alcohol Use: 0.6 - 1.2 oz/week    1-2 Standard drinks or equivalent per week     Comment: socially  . Drug Use: No  . Sexual Activity: Yes    Birth Control/ Protection: None   Other Topics Concern  . Not on file   Social History Narrative   Denies abuse and feels safe at home.      Review of Systems  Constitutional: Negative for fever and chills.  Respiratory: Negative for chest tightness.   Cardiovascular: Negative for chest pain, palpitations and leg swelling.  Endocrine: Negative for cold intolerance and heat intolerance.      Objective:    BP 182/102 mmHg  Pulse 75  Temp(Src) 97.8 F (36.6 C) (Oral)  Resp 16  Ht 5' 1.25" (1.556 m)  Wt 152 lb (68.947 kg)  BMI 28.48 kg/m2  SpO2 99% Nursing note and vital signs reviewed.  Physical Exam  Constitutional: She is oriented to person, place, and time. She appears well-developed and well-nourished. No distress.  Cardiovascular: Normal rate, regular rhythm, normal heart sounds and intact distal pulses.   Pulmonary/Chest: Effort normal and breath sounds normal.  Musculoskeletal:  Left elbow - mild edema with no obvious deformity or discoloration. Mild tenderness elicited over bicep muscle belly. Muscle strength is 4+. Distal pulses and sensation are intact and appropriate.  Neurological: She is alert and oriented to person, place, and time.  Skin: Skin is warm and dry.  Psychiatric: She has a normal  mood and affect. Her behavior is normal. Judgment and thought content normal.       Assessment & Plan:   Problem List Items Addressed This Visit      Cardiovascular and Mediastinum   Essential hypertension    Previously prescribed lisinopril with the adverse side effect of making her feel "different". Discussed importance of blood pressure medication and reducing risk for end organ damage in the future with goal of blood pressure less than 140/90. Following discussion she wishes to retry the lisinopril. If this is unsuccessful, recommend starting hydrochlorothiazide or chlorthalidone. Monitor blood pressure at home. Follow-up in 3 weeks.        Endocrine   Hypothyroidism - Primary    Previous TSH level of 3.5 and notes inadequate control of her  fatigue and weight gain. Increase levothyroxine to 125 g daily. Recheck TSH in 6 weeks.      Relevant Medications   levothyroxine (SYNTHROID, LEVOTHROID) 125 MCG tablet     Musculoskeletal and Integument   Fibromyalgia    Currently maintained on metaxalone and duloxetine. Denies adverse side effects. Continues to experience pain in a variety of muscle/joints. Advised ice and physical activity to help with her symptoms. Refer to rheumatology for osteoarthritis purposes. Follow-up in 3 months or sooner if needed.      Relevant Medications   DULoxetine (CYMBALTA) 60 MG capsule   metaxalone (SKELAXIN) 800 MG tablet     Other   Anxiety and depression    Anxiety and depression appear adequately controlled with current regimen. Denies suicidal ideations. Continue current dosage of duloxetine. Follow-up in 3 months.      Relevant Medications   DULoxetine (CYMBALTA) 60 MG capsule

## 2016-01-02 NOTE — Assessment & Plan Note (Signed)
Previous TSH level of 3.5 and notes inadequate control of her fatigue and weight gain. Increase levothyroxine to 125 g daily. Recheck TSH in 6 weeks.

## 2016-02-15 ENCOUNTER — Ambulatory Visit: Payer: BLUE CROSS/BLUE SHIELD | Admitting: Family

## 2016-02-28 ENCOUNTER — Other Ambulatory Visit: Payer: Self-pay | Admitting: *Deleted

## 2016-02-28 DIAGNOSIS — E039 Hypothyroidism, unspecified: Secondary | ICD-10-CM

## 2016-02-28 MED ORDER — LEVOTHYROXINE SODIUM 125 MCG PO TABS
125.0000 ug | ORAL_TABLET | Freq: Every day | ORAL | Status: DC
Start: 2016-02-28 — End: 2016-03-12

## 2016-02-29 ENCOUNTER — Ambulatory Visit: Payer: BLUE CROSS/BLUE SHIELD | Admitting: Family

## 2016-03-01 ENCOUNTER — Telehealth: Payer: Self-pay | Admitting: *Deleted

## 2016-03-01 NOTE — Telephone Encounter (Signed)
Patient called to cancel vitamin d lab appointment for 03/06/16, patient will call us back to reschedule.

## 2016-03-05 ENCOUNTER — Ambulatory Visit (INDEPENDENT_AMBULATORY_CARE_PROVIDER_SITE_OTHER): Payer: BLUE CROSS/BLUE SHIELD | Admitting: Family

## 2016-03-05 ENCOUNTER — Telehealth: Payer: Self-pay | Admitting: Family

## 2016-03-05 ENCOUNTER — Other Ambulatory Visit (INDEPENDENT_AMBULATORY_CARE_PROVIDER_SITE_OTHER): Payer: BLUE CROSS/BLUE SHIELD

## 2016-03-05 ENCOUNTER — Encounter: Payer: Self-pay | Admitting: Family

## 2016-03-05 VITALS — BP 190/90 | HR 90 | Temp 98.3°F | Resp 16 | Ht 61.25 in | Wt 146.0 lb

## 2016-03-05 DIAGNOSIS — E039 Hypothyroidism, unspecified: Secondary | ICD-10-CM

## 2016-03-05 DIAGNOSIS — M797 Fibromyalgia: Secondary | ICD-10-CM

## 2016-03-05 DIAGNOSIS — I10 Essential (primary) hypertension: Secondary | ICD-10-CM | POA: Diagnosis not present

## 2016-03-05 DIAGNOSIS — R221 Localized swelling, mass and lump, neck: Secondary | ICD-10-CM | POA: Diagnosis not present

## 2016-03-05 LAB — HEMOGLOBIN A1C: HEMOGLOBIN A1C: 5.6 % (ref 4.6–6.5)

## 2016-03-05 LAB — TSH: TSH: 0.06 u[IU]/mL — ABNORMAL LOW (ref 0.35–4.50)

## 2016-03-05 MED ORDER — TAPENTADOL HCL 50 MG PO TABS: 50.0000 mg | ORAL_TABLET | Freq: Two times a day (BID) | ORAL | Status: DC | PRN

## 2016-03-05 MED ORDER — OLMESARTAN MEDOXOMIL-HCTZ 20-12.5 MG PO TABS: 1.0000 | ORAL_TABLET | Freq: Every day | ORAL | Status: DC

## 2016-03-05 NOTE — Assessment & Plan Note (Signed)
Multiple joints pains most likely related to fibromyalgia or possible chronic pain as her exam is not consistent with the amount of pain she is experiencing. Continue current dosage of Cymbalta and metaxalone. Start Nucynta. Encouraged increasing physical activity as tolerated. Follow up in 3 months.

## 2016-03-05 NOTE — Assessment & Plan Note (Signed)
Obtain TSH. Continue current dosage of levothyroxine pending TSH results. 

## 2016-03-05 NOTE — Patient Instructions (Addendum)
Thank you for choosing [facility].  Summary/Instructions:  Please stop taking the lisinopril.  Start taking Benicar-HCT.   Start taking the Nucynta for pain.  Monitor your blood pressure at home.   Your prescription(s) have been submitted to your pharmacy or been printed and provided for you. Please take as directed and contact our office if you believe you are having problem(s) with the medication(s) or have any questions.  Please stop by the lab on the basement level of the building for your blood work. Your results will be released to [facility] (or called to you) after review, usually within 72 hours after test completion. If any changes need to be made, you will be notified at that same time.  If your symptoms worsen or fail to improve, please contact our office for further instruction, or in case of emergency go directly to the emergency room at the closest medical facility.   Myofascial Pain Syndrome and Fibromyalgia Myofascial pain syndrome and fibromyalgia are both pain disorders. This pain may be felt mainly in your muscles.   Myofascial pain syndrome:  Always has trigger points or tender points in the muscle that will cause pain when pressed. The pain may come and go.  Usually affects your neck, upper back, and shoulder areas. The pain often radiates into your arms and hands.  Fibromyalgia:  Has muscle pains and tenderness that come and go.  Is often associated with fatigue and sleep disturbances.  Has trigger points.  Tends to be long-lasting (chronic), but is not life-threatening. Fibromyalgia and myofascial pain are not the same. However, they often occur together. If you have both conditions, each can make the other worse. Both are common and can cause enough pain and fatigue to make day-to-day activities difficult.  CAUSES  The exact causes of fibromyalgia and myofascial pain are not known. People with certain gene types may be more likely to develop  fibromyalgia. Some factors can be triggers for both conditions, such as:   Spine disorders.  Arthritis.  Severe injury (trauma) and other physical stressors.  Being under a lot of stress.  A medical illness. SIGNS AND SYMPTOMS  Fibromyalgia The main symptom of fibromyalgia is widespread pain and tenderness in your muscles. This can vary over time. Pain is sometimes described as stabbing, shooting, or burning. You may have tingling or numbness, too. You may also have sleep problems and fatigue. You may wake up feeling tired and groggy (fibro fog). Other symptoms may include:   Bowel and bladder problems.  Headaches.  Visual problems.  Problems with odors and noises.  Depression or mood changes.  Painful menstrual periods (dysmenorrhea).  Dry skin or eyes. Myofascial pain syndrome Symptoms of myofascial pain syndrome include:   Tight, ropy bands of muscle.   Uncomfortable sensations in muscular areas, such as:  Aching.  Cramping.  Burning.  Numbness.  Tingling.   Muscle weakness.  Trouble moving certain muscles freely (range of motion). DIAGNOSIS  There are no specific tests to diagnose fibromyalgia or myofascial pain syndrome. Both can be hard to diagnose because their symptoms are common in many other conditions. Your health care provider may suspect one or both of these conditions based on your symptoms and medical history. Your health care provider will also do a physical exam.  The key to diagnosing fibromyalgia is having pain, fatigue, and other symptoms for more than three months that cannot be explained by another condition.  The key to diagnosing myofascial pain syndrome is finding trigger points  in muscles that are tender and cause pain elsewhere in your body (referred pain). TREATMENT  Treating fibromyalgia and myofascial pain often requires a team of health care providers. This usually starts with your primary provider and a physical therapist. You may  also find it helpful to work with alternative health care providers, such as massage therapists or acupuncturists. Treatment for fibromyalgia may include medicines. This may include nonsteroidal anti-inflammatory drugs (NSAIDs), along with other medicines.  Treatment for myofascial pain may also include:  NSAIDs.  Cooling and stretching of muscles.  Trigger point injections.  Sound wave (ultrasound) treatments to stimulate muscles. HOME CARE INSTRUCTIONS   Take medicines only as directed by your health care provider.  Exercise as directed by your health care provider or physical therapist.  Try to avoid stressful situations.  Practice relaxation techniques to control your stress. You may want to try:  Biofeedback.  Visual imagery.  Hypnosis.  Muscle relaxation.  Yoga.  Meditation.  Talk to your health care provider about alternative treatments, such as acupuncture or massage treatment.  Maintain a healthy lifestyle. This includes eating a healthy diet and getting enough sleep.  Consider joining a support group.  Do not do activities that stress or strain your muscles. That includes repetitive motions and heavy lifting. SEEK MEDICAL CARE IF:   You have new symptoms.  Your symptoms get worse.  You have side effects from your medicines.  You have trouble sleeping.  Your condition is causing depression or anxiety. FOR MORE INFORMATION   National Fibromyalgia Association: http://www.fmaware.orgwww.fmaware.Egan: http://www.arthritis.orgwww.arthritis.org  American Chronic Pain Association: StreetWrestling.at.https://stevens.biz/   This information is not intended to replace advice given to you by your health care provider. Make sure you discuss any questions you have with your health care provider.   Document Released: 10/29/2005 Document Revised: 11/19/2014 Document Reviewed:  08/04/2014 Elsevier Interactive Patient Education [employer].

## 2016-03-05 NOTE — Progress Notes (Signed)
Pre visit review using our clinic review tool, if applicable. No additional management support is needed unless otherwise documented below in the visit note. 

## 2016-03-05 NOTE — Assessment & Plan Note (Signed)
Neck swelling most likely lymphadenopathy cannot rule out thyroid nodule. Obtain ultrasound and TSH. Continue current dosage of levothyroxine pending TSH and ultrasound results.

## 2016-03-05 NOTE — Progress Notes (Signed)
Subjective:    Patient ID: Cynthia Wolf, female    DOB: 1966-01-24, 50 y.o.   MRN: [id number]  Chief Complaint  Patient presents with  . Follow-up    has some swelling in her throat and lymph nodes, joint pains and swelling, headaches, hypertension    HPI:  Cynthia Wolf is a 50 y.o. female who  has a past medical history of Hypertension; Thyroid disease; Back injury; Osteoarthritis; Endometriosis; Anxiety; Depression; H/O blood clots; STD (sexually transmitted disease); Fibroid; PID (pelvic inflammatory disease); Infertility, female; Migraines; Chicken pox; GERD (gastroesophageal reflux disease); Hyperlipidemia; and Fibromyalgia. and presents today for a follow up office visit.   1.) Hypertension - Currently maintained on lisinopril. Previous history of non-compliance with the medication now reporting to take the medication as prescribed. She was instructed to follow up in 3 weeks which also did not occur. Reports symptoms of headaches and chest pain with no changes in vision. Chest pain is described as a "weird feeling" with the occasional stabbing.   BP Readings from Last 3 Encounters:  03/05/16 190/90  01/02/16 182/102  12/02/15 140/88    2.) Multiple joint pain / swelling - Previously diagnosed with fibromyalgia and currently maintained on metaxalone and duloxetine. Reports taking the medication as prescribed with no adverse side effects. Endorses that the pain is not well controlled with pain in multiple joints and some swelling. No additional trauma. Previous testing in [place] was negative for RA. Pain is described as sharp at times with a severity enough that she cannot get out of bed. Has completed physical therapy for her back which did help to ease her symptoms. Other modifying factors include hot baths at night which eases the pain for about 3 hours. The pain effects her functionality where she cannot walk for long periods of time or distances when exercises. She has  never been on any pain medications in the past.    3.) Neck swelling - Associated symptom of a swollen area located on the left side of her neck has been going on for several weeks. Denies fevers or symptoms related to a cold. Does have previous history of thyroid dysfunction. Most recent TSH was 3.3. No tenderness and has not grown in size.    Allergies  Allergen Reactions  . Shellfish Allergy   . Yellow Dyes (Non-Tartrazine)      Current Outpatient Prescriptions on File Prior to Visit  Medication Sig Dispense Refill  . DULoxetine (CYMBALTA) 60 MG capsule Take 1 capsule (60 mg total) by mouth daily. 90 capsule 0  . levothyroxine (SYNTHROID, LEVOTHROID) 125 MCG tablet Take 1 tablet (125 mcg total) by mouth daily before breakfast. Keep appt for future refills 30 tablet 0  . meloxicam (MOBIC) 7.5 MG tablet Take 7.5 mg by mouth daily.    . metaxalone (SKELAXIN) 800 MG tablet Take 1 tablet (800 mg total) by mouth 3 (three) times daily. 90 tablet 0  . metroNIDAZOLE (METROGEL) 0.75 % vaginal gel Place 1 Applicatorful vaginally at bedtime. Use for 5 days 70 g 0   No current facility-administered medications on file prior to visit.     Past Surgical History  Procedure Laterality Date  . Ovarian cyst surgery    . Laparoscopy     Past Medical History  Diagnosis Date  . Hypertension   . Thyroid disease   . Back injury   . Osteoarthritis   . Endometriosis   . Anxiety   . Depression   . H/O blood clots  from heavy menstrual cycles. Had shot done to stop bleeding per patient  . STD (sexually transmitted disease)     chlamydia  . Fibroid     ?  Marland Kitchen PID (pelvic inflammatory disease)   . Infertility, female   . Migraines   . Chicken pox   . GERD (gastroesophageal reflux disease)   . Hyperlipidemia   . Fibromyalgia     Review of Systems  Constitutional: Negative for fever and chills.  Eyes:       Negative for changes in vision.   Respiratory: Negative for chest tightness and  shortness of breath.   Cardiovascular: Positive for chest pain. Negative for palpitations and leg swelling.  Musculoskeletal: Positive for myalgias, back pain and arthralgias.  Neurological: Positive for headaches.      Objective:    BP 190/90 mmHg  Pulse 90  Temp(Src) 98.3 F (36.8 C) (Oral)  Resp 16  Ht 5' 1.25" (1.556 m)  Wt 146 lb (66.225 kg)  BMI 27.35 kg/m2  SpO2 97% Nursing note and vital signs reviewed.  Physical Exam  Constitutional: She is oriented to person, place, and time. She appears well-developed and well-nourished. No distress.  Neck: Neck supple. No thyromegaly present.  Small mass that is mostly soft located at the base of her neck on the left side is non-tender.  Cardiovascular: Normal rate, regular rhythm, normal heart sounds and intact distal pulses.   Pulmonary/Chest: Effort normal and breath sounds normal.  Musculoskeletal:  Multiple joint pains and myalgias in her upper and lower extremity.   Neurological: She is alert and oriented to person, place, and time.  Skin: Skin is warm and dry.  Psychiatric: She has a normal mood and affect. Her behavior is normal. Judgment and thought content normal.       Assessment & Plan:   Problem List Items Addressed This Visit      Cardiovascular and Mediastinum   Essential hypertension - Primary    In office EKG shows short PR interval otherwise normal sinus. Hypertension is uncontrolled with current regimen and question patient compliance given history. Discontinue lisinopril. Start Benicar HCT. Encouraged to monitor blood pressure at home. Headache symptoms most likely related to increased blood pressure. Follow up in 3 weeks or sooner if needed.       Relevant Medications   olmesartan-hydrochlorothiazide (BENICAR HCT) 20-12.5 MG tablet   Other Relevant Orders   EKG 12-Lead (Completed)     Endocrine   Hypothyroidism    Obtain TSH. Continue current dosage of levothyroxine pending TSH results.        Relevant Orders   TSH     Musculoskeletal and Integument   Fibromyalgia    Multiple joints pains most likely related to fibromyalgia or possible chronic pain as her exam is not consistent with the amount of pain she is experiencing. Continue current dosage of Cymbalta and metaxalone. Start Nucynta. Encouraged increasing physical activity as tolerated. Follow up in 3 months.       Relevant Medications   tapentadol (NUCYNTA) 50 MG TABS tablet   Other Relevant Orders   Hemoglobin A1c     Other   Neck swelling    Neck swelling most likely lymphadenopathy cannot rule out thyroid nodule. Obtain ultrasound and TSH. Continue current dosage of levothyroxine pending TSH and ultrasound results.       Relevant Orders   US Soft Tissue Head/Neck       I have discontinued Ms. Bonk's lisinopril. I am also having her start  on tapentadol and olmesartan-hydrochlorothiazide. Additionally, I am having her maintain her meloxicam, metroNIDAZOLE, DULoxetine, metaxalone, and levothyroxine.   Meds ordered this encounter  Medications  . DISCONTD: lisinopril (PRINIVIL,ZESTRIL) 10 MG tablet    Sig: Take 10 mg by mouth daily.  . tapentadol (NUCYNTA) 50 MG TABS tablet    Sig: Take 1 tablet (50 mg total) by mouth 2 (two) times daily as needed.    Dispense:  60 tablet    Refill:  0    Order Specific Question:  Supervising Provider    Answer:  Pricilla Holm A [id number]  . olmesartan-hydrochlorothiazide (BENICAR HCT) 20-12.5 MG tablet    Sig: Take 1 tablet by mouth daily.    Dispense:  30 tablet    Refill:  1    Order Specific Question:  Supervising Provider    Answer:  Pricilla Holm A [id number]     Follow-up: Return in about 3 weeks (around 03/26/2016) for BP an fibromyalgia.  Mauricio Po, FNP

## 2016-03-05 NOTE — Telephone Encounter (Signed)
Please inform patient that her A1c is normal with no evidence of diabetes and her thyroid function is slightly overactive with the current dose. I would like her to repeat her TSH in 6 weeks.

## 2016-03-05 NOTE — Assessment & Plan Note (Addendum)
In office EKG shows short PR interval otherwise normal sinus. Hypertension is uncontrolled with current regimen and question patient compliance given history. Discontinue lisinopril. Start Benicar HCT. Encouraged to monitor blood pressure at home. Headache symptoms most likely related to increased blood pressure. Follow up in 3 weeks or sooner if needed.

## 2016-03-06 ENCOUNTER — Other Ambulatory Visit: Payer: BLUE CROSS/BLUE SHIELD

## 2016-03-07 ENCOUNTER — Telehealth: Payer: Self-pay | Admitting: Family

## 2016-03-07 ENCOUNTER — Telehealth: Payer: Self-pay

## 2016-03-07 NOTE — Telephone Encounter (Signed)
Left message for patient to call & schedule lab appt.for vitamin d recheck.

## 2016-03-07 NOTE — Telephone Encounter (Signed)
Per greg advised pt to cut BP medication in half. Pt is aware.

## 2016-03-07 NOTE — Telephone Encounter (Signed)
Ok with me 

## 2016-03-07 NOTE — Telephone Encounter (Signed)
Patient is requesting to transfer from [facility] to [place].  Please advise.

## 2016-03-07 NOTE — Telephone Encounter (Signed)
BP readings for today were low is concerned about BP meds. Please advise on what she needs to do  11:50 am- 87/64, 79 1:14 pm- 99/75, 79 3:36 pm- 101/77, 79

## 2016-03-07 NOTE — Telephone Encounter (Signed)
Please let her know I am unable to accept her at this time.

## 2016-03-07 NOTE — Telephone Encounter (Signed)
Dr.Crawford, would you be able to take patient on at this time?

## 2016-03-08 ENCOUNTER — Telehealth: Payer: Self-pay | Admitting: Family

## 2016-03-08 ENCOUNTER — Ambulatory Visit
Admission: RE | Admit: 2016-03-08 | Discharge: 2016-03-08 | Disposition: A | Discharge status: Home / Self Care | Payer: BLUE CROSS/BLUE SHIELD | Source: Ambulatory Visit | Attending: Family | Admitting: Family

## 2016-03-08 DIAGNOSIS — R221 Localized swelling, mass and lump, neck: Secondary | ICD-10-CM

## 2016-03-08 DIAGNOSIS — E039 Hypothyroidism, unspecified: Secondary | ICD-10-CM

## 2016-03-08 NOTE — Telephone Encounter (Signed)
Am not taking elective transfers right now but if still wanting to transfer in the fall call back.

## 2016-03-08 NOTE — Telephone Encounter (Signed)
Please inform patient that her ultrasound results do show a thyroid nodule that is recommended for biopsy. If she is willing I will send in the referral to have the biopsy completed. The biopsy will confirm if it is a true nodule or pseudo nodule (mimics a nodule but is not).

## 2016-03-09 NOTE — Telephone Encounter (Signed)
Dr. Jones, would you be able to take patient on? °

## 2016-03-12 MED ORDER — LEVOTHYROXINE SODIUM 112 MCG PO TABS: 112.0000 ug | ORAL_TABLET | Freq: Every day | ORAL | Status: DC

## 2016-03-12 NOTE — Telephone Encounter (Signed)
Would like to go back on the Levothyroxine 112 mcg and she does want a referral placed.

## 2016-03-12 NOTE — Telephone Encounter (Signed)
Medication reduced and ultrasound order placed.

## 2016-03-13 ENCOUNTER — Telehealth: Payer: Self-pay

## 2016-03-13 DIAGNOSIS — E038 Other specified hypothyroidism: Secondary | ICD-10-CM

## 2016-03-13 NOTE — Telephone Encounter (Signed)
LVM letting pt know.  

## 2016-03-13 NOTE — Addendum Note (Signed)
Addended by: Delice Bison E on: 03/13/2016 04:14 PM   Modules accepted: Orders

## 2016-03-13 NOTE — Telephone Encounter (Signed)
Pt declined biopsy. Sent a referral to endo per pts request. Would like to see a thyroid doctor for further issues with her thyroid.

## 2016-03-13 NOTE — Telephone Encounter (Signed)
Returned call and LVM for pt to call back.

## 2016-03-13 NOTE — Telephone Encounter (Signed)
Please call patient back in regards  °

## 2016-03-13 NOTE — Telephone Encounter (Signed)
No callback from patient to schedule appt. Okay to close encounter. Please advise.

## 2016-03-13 NOTE — Telephone Encounter (Signed)
Patient would like to talk to you all about some questions she had.

## 2016-03-15 NOTE — Telephone Encounter (Signed)
Ok to close

## 2016-03-26 ENCOUNTER — Ambulatory Visit: Payer: BLUE CROSS/BLUE SHIELD | Admitting: Family

## 2016-03-26 NOTE — Telephone Encounter (Signed)
Ok with me 

## 2016-03-26 NOTE — Telephone Encounter (Signed)
Got patient scheduled

## 2016-04-17 ENCOUNTER — Other Ambulatory Visit (INDEPENDENT_AMBULATORY_CARE_PROVIDER_SITE_OTHER): Payer: BLUE CROSS/BLUE SHIELD

## 2016-04-17 ENCOUNTER — Ambulatory Visit (INDEPENDENT_AMBULATORY_CARE_PROVIDER_SITE_OTHER): Payer: BLUE CROSS/BLUE SHIELD | Admitting: Internal Medicine

## 2016-04-17 ENCOUNTER — Encounter: Payer: Self-pay | Admitting: Internal Medicine

## 2016-04-17 VITALS — BP 160/86 | HR 84 | Temp 98.4°F | Resp 16 | Ht 61.25 in | Wt 147.0 lb

## 2016-04-17 DIAGNOSIS — I10 Essential (primary) hypertension: Secondary | ICD-10-CM

## 2016-04-17 DIAGNOSIS — Z91018 Allergy to other foods: Secondary | ICD-10-CM | POA: Diagnosis not present

## 2016-04-17 DIAGNOSIS — E039 Hypothyroidism, unspecified: Secondary | ICD-10-CM | POA: Diagnosis not present

## 2016-04-17 DIAGNOSIS — M797 Fibromyalgia: Secondary | ICD-10-CM

## 2016-04-17 DIAGNOSIS — E041 Nontoxic single thyroid nodule: Secondary | ICD-10-CM | POA: Insufficient documentation

## 2016-04-17 DIAGNOSIS — Z1231 Encounter for screening mammogram for malignant neoplasm of breast: Secondary | ICD-10-CM

## 2016-04-17 LAB — MAGNESIUM: MAGNESIUM: 2.1 mg/dL (ref 1.5–2.5)

## 2016-04-17 LAB — BASIC METABOLIC PANEL
BUN: 8 mg/dL (ref 6–23)
CHLORIDE: 105 meq/L (ref 96–112)
CO2: 29 meq/L (ref 19–32)
CREATININE: 0.57 mg/dL (ref 0.40–1.20)
Calcium: 9.4 mg/dL (ref 8.4–10.5)
GFR: 120.59 mL/min (ref >60.00)
Glucose, Bld: 93 mg/dL (ref 70–99)
POTASSIUM: 4 meq/L (ref 3.5–5.1)
SODIUM: 140 meq/L (ref 135–145)

## 2016-04-17 LAB — TSH: TSH: 0.04 u[IU]/mL — AB (ref 0.35–4.50)

## 2016-04-17 MED ORDER — TELMISARTAN 80 MG PO TABS: 80.0000 mg | ORAL_TABLET | Freq: Every day | ORAL | Status: DC

## 2016-04-17 MED ORDER — LEVOTHYROXINE SODIUM 100 MCG PO TABS: 100.0000 ug | ORAL_TABLET | Freq: Every day | ORAL | Status: DC

## 2016-04-17 NOTE — Patient Instructions (Signed)
Hypertension Hypertension, commonly called high blood pressure, is when the force of blood pumping through your arteries is too strong. Your arteries are the blood vessels that carry blood from your heart throughout your body. A blood pressure reading consists of a higher number over a lower number, such as 110/72. The higher number (systolic) is the pressure inside your arteries when your heart pumps. The lower number (diastolic) is the pressure inside your arteries when your heart relaxes. Ideally you want your blood pressure below 120/80. Hypertension forces your heart to work harder to pump blood. Your arteries may become narrow or stiff. Having untreated or uncontrolled hypertension can cause heart attack, stroke, kidney disease, and other problems. RISK FACTORS Some risk factors for high blood pressure are controllable. Others are not.  Risk factors you cannot control include:   Race. You may be at higher risk if you are African American.  Age. Risk increases with age.  Gender. Men are at higher risk than women before age 45 years. After age 65, women are at higher risk than men. Risk factors you can control include:  Not getting enough exercise or physical activity.  Being overweight.  Getting too much fat, sugar, calories, or salt in your diet.  Drinking too much alcohol. SIGNS AND SYMPTOMS Hypertension does not usually cause signs or symptoms. Extremely high blood pressure (hypertensive crisis) may cause headache, anxiety, shortness of breath, and nosebleed. DIAGNOSIS To check if you have hypertension, your health care provider will measure your blood pressure while you are seated, with your arm held at the level of your heart. It should be measured at least twice using the same arm. Certain conditions can cause a difference in blood pressure between your right and left arms. A blood pressure reading that is higher than normal on one occasion does not mean that you need treatment. If  it is not clear whether you have high blood pressure, you may be asked to return on a different day to have your blood pressure checked again. Or, you may be asked to monitor your blood pressure at home for 1 or more weeks. TREATMENT Treating high blood pressure includes making lifestyle changes and possibly taking medicine. Living a healthy lifestyle can help lower high blood pressure. You may need to change some of your habits. Lifestyle changes may include:  Following the DASH diet. This diet is high in fruits, vegetables, and whole grains. It is low in salt, red meat, and added sugars.  Keep your sodium intake below 2,300 mg per day.  Getting at least 30-45 minutes of aerobic exercise at least 4 times per week.  Losing weight if necessary.  Not smoking.  Limiting alcoholic beverages.  Learning ways to reduce stress. Your health care provider may prescribe medicine if lifestyle changes are not enough to get your blood pressure under control, and if one of the following is true:  You are 18-59 years of age and your systolic blood pressure is above 140.  You are 60 years of age or older, and your systolic blood pressure is above 150.  Your diastolic blood pressure is above 90.  You have diabetes, and your systolic blood pressure is over 140 or your diastolic blood pressure is over 90.  You have kidney disease and your blood pressure is above 140/90.  You have heart disease and your blood pressure is above 140/90. Your personal target blood pressure may vary depending on your medical conditions, your age, and other factors. HOME CARE INSTRUCTIONS    Have your blood pressure rechecked as directed by your health care provider.   Take medicines only as directed by your health care provider. Follow the directions carefully. Blood pressure medicines must be taken as prescribed. The medicine does not work as well when you skip doses. Skipping doses also puts you at risk for  problems.  Do not smoke.   Monitor your blood pressure at home as directed by your health care provider. SEEK MEDICAL CARE IF:   You think you are having a reaction to medicines taken.  You have recurrent headaches or feel dizzy.  You have swelling in your ankles.  You have trouble with your vision. SEEK IMMEDIATE MEDICAL CARE IF:  You develop a severe headache or confusion.  You have unusual weakness, numbness, or feel faint.  You have severe chest or abdominal pain.  You vomit repeatedly.  You have trouble breathing. MAKE SURE YOU:   Understand these instructions.  Will watch your condition.  Will get help right away if you are not doing well or get worse.   This information is not intended to replace advice given to you by your health care provider. Make sure you discuss any questions you have with your health care provider.   Document Released: 10/29/2005 Document Revised: 03/15/2015 Document Reviewed: 08/21/2013 Elsevier Interactive Patient Education 2016 Elsevier Inc.  

## 2016-04-17 NOTE — Progress Notes (Signed)
Subjective:  Patient ID: Cynthia Wolf, female    DOB: 1966-08-12  Age: 50 y.o. MRN: [id number]  CC: Hypertension and Hypothyroidism  NEW TO ME  HPI Cynthia Wolf presents for a follow-up on hypertension and hypothyroidism.  She wants to be tested for food allergies, she states over the years she has had several bad reactions to fish oils and shellfish. One episode was anaphylaxis with ingestion of fish oils and the other one was hives and itching with topical exposure to shellfish. The last time she had a reaction to shellfish was 2 years ago.  She is also due for follow-up on high blood pressure. She was recently prescribed a combination of an ARB plus hydrochlorothiazide. She complained that it made her feel dizzy and lightheaded and her blood pressure was too low so she needs an alternative.  She is also due for follow-up on a recent thyroid ultrasound that showed a 1.5 cm nodule on the left side. She denies any discomfort in the area.  Outpatient Prescriptions Prior to Visit  Medication Sig Dispense Refill  . DULoxetine (CYMBALTA) 60 MG capsule Take 1 capsule (60 mg total) by mouth daily. 90 capsule 0  . metaxalone (SKELAXIN) 800 MG tablet Take 1 tablet (800 mg total) by mouth 3 (three) times daily. 90 tablet 0  . tapentadol (NUCYNTA) 50 MG TABS tablet Take 1 tablet (50 mg total) by mouth 2 (two) times daily as needed. 60 tablet 0  . levothyroxine (SYNTHROID, LEVOTHROID) 112 MCG tablet Take 1 tablet (112 mcg total) by mouth daily before breakfast. Keep appt for future refills 30 tablet 2  . metroNIDAZOLE (METROGEL) 0.75 % vaginal gel Place 1 Applicatorful vaginally at bedtime. Use for 5 days 70 g 0  . meloxicam (MOBIC) 7.5 MG tablet Take 7.5 mg by mouth daily. Reported on 04/17/2016    . olmesartan-hydrochlorothiazide (BENICAR HCT) 20-12.5 MG tablet Take 1 tablet by mouth daily. (Patient not taking: Reported on 04/17/2016) 30 tablet 1   No facility-administered medications prior to  visit.    ROS Review of Systems  Constitutional: Negative.  Negative for diaphoresis, appetite change, fatigue and unexpected weight change.  HENT: Negative.  Negative for sinus pressure and trouble swallowing.   Eyes: Negative.  Negative for visual disturbance.  Respiratory: Negative.  Negative for cough, choking, chest tightness, shortness of breath and stridor.   Cardiovascular: Negative.  Negative for chest pain, palpitations and leg swelling.  Gastrointestinal: Negative.  Negative for nausea, vomiting, abdominal pain, diarrhea and constipation.  Endocrine: Negative.   Genitourinary: Negative.   Musculoskeletal: Negative.  Negative for myalgias and back pain.  Skin: Negative.  Negative for pallor and rash.  Allergic/Immunologic: Negative.   Neurological: Negative.  Negative for dizziness, tremors, weakness, light-headedness, numbness and headaches.  Hematological: Negative.  Negative for adenopathy. Does not bruise/bleed easily.  Psychiatric/Behavioral: Negative.     Objective:  BP 160/86 mmHg  Pulse 84  Temp(Src) 98.4 F (36.9 C) (Oral)  Resp 16  Ht 5' 1.25" (1.556 m)  Wt 147 lb (66.679 kg)  BMI 27.54 kg/m2  SpO2 96%  BP Readings from Last 3 Encounters:  04/17/16 160/86  03/05/16 190/90  01/02/16 182/102    Wt Readings from Last 3 Encounters:  04/17/16 147 lb (66.679 kg)  03/05/16 146 lb (66.225 kg)  01/02/16 152 lb (68.947 kg)    Physical Exam  Constitutional: She is oriented to person, place, and time. No distress.  HENT:  Mouth/Throat: Oropharynx is clear and moist. No oropharyngeal  exudate.  Eyes: Conjunctivae are normal. Right eye exhibits no discharge. Left eye exhibits no discharge. No scleral icterus.  Neck: Normal range of motion. Neck supple. No JVD present. No tracheal deviation present. No thyroid mass and no thyromegaly present.  Cardiovascular: Normal rate, regular rhythm, normal heart sounds and intact distal pulses.  Exam reveals no gallop and no  friction rub.   No murmur heard. Pulmonary/Chest: Effort normal and breath sounds normal. No stridor. No respiratory distress. She has no wheezes. She has no rales. She exhibits no tenderness.  Abdominal: Soft. Bowel sounds are normal. She exhibits no distension and no mass. There is no tenderness. There is no rebound and no guarding.  Musculoskeletal: Normal range of motion. She exhibits no edema or tenderness.  Lymphadenopathy:    She has no cervical adenopathy.  Neurological: She is oriented to person, place, and time.  Skin: Skin is warm and dry. No rash noted. She is not diaphoretic. No erythema. No pallor.  Vitals reviewed.   Lab Results  Component Value Date   WBC 7.9 10/20/2014   HGB 14.5 10/20/2014   HCT 42.5 10/20/2014   PLT 221 10/20/2014   GLUCOSE 93 04/17/2016   CHOL 234* 12/02/2015   TRIG 95 12/02/2015   HDL 86 12/02/2015   LDLCALC 129 12/02/2015   ALT 16 12/02/2015   AST 20 12/02/2015   NA 140 04/17/2016   K 4.0 04/17/2016   CL 105 04/17/2016   CREATININE 0.57 04/17/2016   BUN 8 04/17/2016   CO2 29 04/17/2016   TSH 0.04* 04/17/2016   HGBA1C 5.6 03/05/2016    US Soft Tissue Head/neck  03/08/2016  CLINICAL DATA:  Neck swelling EXAM: THYROID ULTRASOUND TECHNIQUE: Ultrasound examination of the thyroid gland and adjacent soft tissues was performed. COMPARISON:  None. FINDINGS: Right thyroid lobe Measurements: 4.3 x 1.3 x 1.5 cm. Heterogeneous glandular tissue without focal nodule. Left thyroid lobe Measurements: 3.3 x 0.8 x 1.0 cm. Heterogeneous glandular tissue. 1.5 x 0.6 x 1.0 cm ill-defined mid lobe nodule versus pseudo nodule with central calcifications. Isthmus Thickness: 2 mm.  No nodules visualized. Lymphadenopathy None visualized. IMPRESSION: Solitary left mid lobe 1.5 cm nodule versus pseudo nodule. Findings meet consensus criteria for biopsy. Ultrasound-guided fine needle aspiration should be considered, as per the consensus statement: Management of Thyroid  Nodules Detected at [place]: Society of Radiologists in [place]. Radiology 2005; [id number]. Electronically Signed   By: Marybelle Killings M.D.   On: 03/08/2016 16:23    Assessment & Plan:   Jequetta was seen today for hypertension and hypothyroidism.  Diagnoses and all orders for this visit:  Hypothyroidism, unspecified hypothyroidism type- her TSH is suppressed so I have decreased her dose of levothyroxine -     TSH; Future -     levothyroxine (SYNTHROID, LEVOTHROID) 100 MCG tablet; Take 1 tablet (100 mcg total) by mouth daily.  Essential hypertension- her blood pressure is not well controlled, it sounds like she did not tolerate hydrochlorothiazide, I've asked her to restart an ARB, her labs show no evidence of secondary causes or end organ damage. -     telmisartan (MICARDIS) 80 MG tablet; Take 1 tablet (80 mg total) by mouth daily. -     Basic metabolic panel; Future -     Magnesium; Future  Fibromyalgia- symptoms have improved recently with treatment -     Magnesium; Future  Visit for screening mammogram -     MM DIGITAL SCREENING BILATERAL; Future  Food allergy-  I will screen her for food allergies -     Cancel: IgG food panel -     Allergen food profile specific IgE; Future -     Food Allergy Profile; Future  Left thyroid nodule- she needs to consider having a biopsy done of the thyroid nodule so I've referred her to ENT. -     Ambulatory referral to ENT  Other orders -     Cancel: levothyroxine (SYNTHROID, LEVOTHROID) 112 MCG tablet; Take 1 tablet (112 mcg total) by mouth daily before breakfast. Keep appt for future refills  I have discontinued Ms. Pauwels's meloxicam, olmesartan-hydrochlorothiazide, and levothyroxine. I am also having her start on telmisartan and levothyroxine. Additionally, I am having her maintain her metroNIDAZOLE, DULoxetine, metaxalone, and tapentadol.  Meds ordered this encounter  Medications  . telmisartan (MICARDIS) 80  MG tablet    Sig: Take 1 tablet (80 mg total) by mouth daily.    Dispense:  90 tablet    Refill:  1  . levothyroxine (SYNTHROID, LEVOTHROID) 100 MCG tablet    Sig: Take 1 tablet (100 mcg total) by mouth daily.    Dispense:  90 tablet    Refill:  1     Follow-up: Return in about 2 months (around 06/17/2016).  Scarlette Calico, MD

## 2016-04-17 NOTE — Progress Notes (Signed)
Pre visit review using our clinic review tool, if applicable. No additional management support is needed unless otherwise documented below in the visit note. 

## 2016-04-18 LAB — FOOD ALLERGY PROFILE
Allergen, Salmon, f41: <0.1 kU/L
Almonds: <0.1 kU/L
Cashew IgE: <0.1 kU/L
Egg White IgE: <0.1 kU/L
Fish Cod: <0.1 kU/L
Hazelnut: <0.1 kU/L
Milk IgE: <0.1 kU/L
Peanut IgE: <0.1 kU/L
Scallop IgE: <0.1 kU/L

## 2016-04-19 ENCOUNTER — Encounter: Payer: Self-pay | Admitting: Internal Medicine

## 2016-04-19 LAB — ALLERGEN FOOD PROFILE SPECIFIC IGE
Allergen Apple, IgE: <0.1 kU/L
Chicken IgE: <0.1 kU/L
Egg White IgE: <0.1 kU/L
IGE (IMMUNOGLOBULIN E), SERUM: 6 [IU]/mL (ref 0–100)
Milk IgE: <0.1 kU/L
Tuna: <0.1 kU/L

## 2016-04-20 ENCOUNTER — Encounter: Payer: Self-pay | Admitting: Family

## 2016-04-23 ENCOUNTER — Ambulatory Visit: Payer: BLUE CROSS/BLUE SHIELD | Admitting: Family

## 2016-05-02 NOTE — Telephone Encounter (Signed)
Patient call. Informed her of the notes. And to pick up RX.

## 2016-05-23 NOTE — Telephone Encounter (Signed)
Patient has not call to reschedule. Can we send letter?

## 2016-05-23 NOTE — Telephone Encounter (Signed)
Let's try calling her first. So we can schedule

## 2016-05-25 NOTE — Telephone Encounter (Signed)
Left voicemail for pt to call back to schedule lab appt.

## 2016-05-28 ENCOUNTER — Ambulatory Visit (INDEPENDENT_AMBULATORY_CARE_PROVIDER_SITE_OTHER): Payer: BLUE CROSS/BLUE SHIELD | Admitting: Internal Medicine

## 2016-05-28 ENCOUNTER — Telehealth: Payer: Self-pay | Admitting: Family

## 2016-05-28 ENCOUNTER — Encounter: Payer: Self-pay | Admitting: Internal Medicine

## 2016-05-28 VITALS — BP 126/90 | HR 77 | Ht 61.25 in | Wt 148.2 lb

## 2016-05-28 DIAGNOSIS — M5441 Lumbago with sciatica, right side: Secondary | ICD-10-CM | POA: Diagnosis not present

## 2016-05-28 MED ORDER — GABAPENTIN 100 MG PO CAPS: 100.0000 mg | ORAL_CAPSULE | Freq: Three times a day (TID) | ORAL | Status: DC

## 2016-05-28 MED ORDER — PREDNISONE 10 MG PO TABS: ORAL_TABLET | ORAL | Status: DC

## 2016-05-28 NOTE — Telephone Encounter (Signed)
[facility]  Patient Name: Cynthia Wolf  DOB: 02/15/1966    Initial Comment Caller states having lower back pain near hip bones; pressure on RT leg; feels like nerve is beginning to pinch; 2 wks;    Nurse Assessment  Nurse: Wynetta Emery, RN, Baker Janus Date/Time (Eastern Time): 05/28/2016 8:17:37 AM  Confirm and document reason for call. If symptomatic, describe symptoms. You must click the next button to save text entered. ---Yerania states having lower back pain near hip bones; pressure on RT leg; feels like nerve is beginning to pinch pain is radiating to top of right foot onset 2 wks; no apparent injury  Has the patient traveled out of the country within the last 30 days? ---No  Does the patient have any new or worsening symptoms? ---Yes  Will a triage be completed? ---Yes  Related visit to physician within the last 2 weeks? ---Yes  Does the PT have any chronic conditions? (i.e. diabetes, asthma, etc.) ---No  Is the patient pregnant or possibly pregnant? (Ask all females between the ages of 7-55) ---No  Is this a behavioral health or substance abuse call? ---No     Guidelines    Guideline Title Affirmed Question Affirmed Notes  Back Pain [1] SEVERE back pain (e.g., excruciating, unable to do any normal activities) AND [2] not improved 2 hours after pain medicine    Final Disposition User   See Physician within 4 Hours (or PCP triage) Wynetta Emery, RN, Baker Janus    Comments  NOTE: No available appt at [facility] office 05-28-2016 928am arrival time 945 appt with Shanon Ace at [facility] location   Referrals  REFERRED TO PCP OFFICE   Disagree/Comply: Comply

## 2016-05-28 NOTE — Progress Notes (Signed)
Pre visit review using our clinic review tool, if applicable. No additional management support is needed unless otherwise documented below in the visit note. 

## 2016-05-28 NOTE — Progress Notes (Signed)
Chief Complaint  Patient presents with  . Back Pain    Low back, travels down to the front of her leg, pt has had back problems for years.    HPI: Cynthia Wolf 50 y.o.  sda PCP NA  [facility] for back pain 2 weeks and not right leg with pinching sensation  For 2 weeks  No fever  Hx Fm depression  Same complainet noted  Ed visit 9 22  16  but got better   This time for almost 2 weeks no  Using icy hot  And heat pad.   Worse    Over last days. Describes as a hot cold type pain that goes down her right posterior thigh around to the top of her foot. Feels weak but no falling or otherwise weakness. No bowel or bladder changes. Remote back injury   Domestic violenc e.     ROS: See pertinent positives and negatives per HPI. No fever chills or injury. Takes Nucynta as needed not very often takes Robaxin as needed for her fibromyalgia.  Past Medical History  Diagnosis Date  . Hypertension   . Thyroid disease   . Back injury   . Osteoarthritis   . Endometriosis   . Anxiety   . Depression   . H/O blood clots     from heavy menstrual cycles. Had shot done to stop bleeding per patient  . STD (sexually transmitted disease)     chlamydia  . Fibroid     ?  Marland Kitchen PID (pelvic inflammatory disease)   . Infertility, female   . Migraines   . Chicken pox   . GERD (gastroesophageal reflux disease)   . Hyperlipidemia   . Fibromyalgia     Family History  Problem Relation Age of Onset  . Breast cancer Maternal Aunt   . Other Maternal Uncle     tumor  . Leukemia Paternal Uncle   . Cancer Maternal Grandfather   . Arthritis Mother   . Hyperlipidemia Mother   . Stroke Mother   . Hypertension Mother   . Diabetes Mother   . Hyperlipidemia Father     Social History   Social History  . Marital Status: Married    Spouse Name: N/A  . Number of Children: 1  . Years of Education: 12   Occupational History  . [employer] Associate    Social History Main Topics  . Smoking  status: Former Smoker -- 0.50 packs/day for 37 years    Types: Cigarettes    Start date: 04/17/2002    Quit date: 04/15/2016  . Smokeless tobacco: Never Used  . Alcohol Use: 0.6 - 1.2 oz/week    1-2 Standard drinks or equivalent per week     Comment: socially  . Drug Use: No  . Sexual Activity: Yes    Birth Control/ Protection: None   Other Topics Concern  . None   Social History Narrative   Denies abuse and feels safe at home.     Outpatient Prescriptions Prior to Visit  Medication Sig Dispense Refill  . DULoxetine (CYMBALTA) 60 MG capsule Take 1 capsule (60 mg total) by mouth daily. 90 capsule 0  . levothyroxine (SYNTHROID, LEVOTHROID) 100 MCG tablet Take 1 tablet (100 mcg total) by mouth daily. 90 tablet 1  . metaxalone (SKELAXIN) 800 MG tablet Take 1 tablet (800 mg total) by mouth 3 (three) times daily. 90 tablet 0  . tapentadol (NUCYNTA) 50 MG TABS tablet Take 1 tablet (50  mg total) by mouth 2 (two) times daily as needed. 60 tablet 0  . telmisartan (MICARDIS) 80 MG tablet Take 1 tablet (80 mg total) by mouth daily. 90 tablet 1  . metroNIDAZOLE (METROGEL) 0.75 % vaginal gel Place 1 Applicatorful vaginally at bedtime. Use for 5 days 70 g 0   No facility-administered medications prior to visit.     EXAM:  BP 126/90 mmHg  Pulse 77  Ht 5' 1.25" (1.556 m)  Wt 148 lb 4 oz (67.246 kg)  BMI 27.77 kg/m2  SpO2 98%  Body mass index is 27.77 kg/(m^2).  GENERAL: vitals reviewed and listed above, alert, oriented, appears well hydrated and in no acute distress bends over can get up to table  Walk antalgid no foot drop  HEENT: atraumatic, conjunctiva  clear, no obvious abnormalities on inspection of external nose and ears  MS: moves all extremities without noticeable focal  Abnormality no atrophy  ? slr  dtrs ankle 2+ hard to elicit patellar  No de sensation rash no swelling  riogh tlow back area of discomfort no midline tenderness  PSYCH: pleasant and cooperative, no obvious  depression or anxiety  ASSESSMENT AND PLAN:  Discussed the following assessment and plan:  Low back pain with radiation, right - acute on crhonic sounds radicular  no obv weakness pred and nueronitn and see pcp may benefot from  pt physical modalities etc .  -Patient advised to return or notify health care team  if symptoms worsen ,persist or new concerns arise.  Patient Instructions    This acts like  mechanical back pain with a pinched nerve  Or disc .   Trial prednisone to decrease swelling around the nerve.  and gabapentin for nerve pain . Caution with drowsiness when first start   Make  appt with PCP for 2-3 weeks from now for FU sometimes   Physical modalities such as PT  ac[facility]ure etc can be helpful in controlling sx.       Standley Brooking. Anusha Claus M.D.

## 2016-05-28 NOTE — Patient Instructions (Addendum)
   This acts like  mechanical back pain with a pinched nerve  Or disc .   Trial prednisone to decrease swelling around the nerve.  and gabapentin for nerve pain . Caution with drowsiness when first start   Make  appt with PCP for 2-3 weeks from now for FU sometimes   Physical modalities such as PT  ac[facility] etc can be helpful in controlling sx.

## 2016-05-28 NOTE — Telephone Encounter (Signed)
Letter sent. Encounter closed

## 2016-05-29 ENCOUNTER — Encounter: Payer: Self-pay | Admitting: Internal Medicine

## 2016-06-04 ENCOUNTER — Ambulatory Visit: Payer: BLUE CROSS/BLUE SHIELD | Admitting: Internal Medicine

## 2016-06-05 ENCOUNTER — Ambulatory Visit (INDEPENDENT_AMBULATORY_CARE_PROVIDER_SITE_OTHER): Payer: BLUE CROSS/BLUE SHIELD

## 2016-06-05 DIAGNOSIS — Z111 Encounter for screening for respiratory tuberculosis: Secondary | ICD-10-CM

## 2016-06-07 LAB — TB SKIN TEST
INDURATION: 0 mm
TB SKIN TEST: NEGATIVE

## 2016-06-14 ENCOUNTER — Telehealth: Payer: Self-pay | Admitting: Certified Nurse Midwife

## 2016-06-14 ENCOUNTER — Encounter: Payer: Self-pay | Admitting: Certified Nurse Midwife

## 2016-06-14 ENCOUNTER — Ambulatory Visit (INDEPENDENT_AMBULATORY_CARE_PROVIDER_SITE_OTHER): Payer: BLUE CROSS/BLUE SHIELD | Admitting: Certified Nurse Midwife

## 2016-06-14 VITALS — BP 120/78 | HR 70 | Temp 98.5°F | Resp 16 | Ht 61.25 in | Wt 145.0 lb

## 2016-06-14 DIAGNOSIS — N390 Urinary tract infection, site not specified: Secondary | ICD-10-CM | POA: Diagnosis not present

## 2016-06-14 DIAGNOSIS — R319 Hematuria, unspecified: Secondary | ICD-10-CM

## 2016-06-14 DIAGNOSIS — N898 Other specified noninflammatory disorders of vagina: Secondary | ICD-10-CM

## 2016-06-14 LAB — POCT URINALYSIS DIPSTICK
BILIRUBIN UA: NEGATIVE
GLUCOSE UA: NEGATIVE
Ketones, UA: NEGATIVE
LEUKOCYTES UA: NEGATIVE
NITRITE UA: NEGATIVE
PH UA: 5
Protein, UA: NEGATIVE
Urobilinogen, UA: NEGATIVE

## 2016-06-14 MED ORDER — SULFAMETHOXAZOLE-TRIMETHOPRIM 800-160 MG PO TABS: 1.0000 | ORAL_TABLET | Freq: Two times a day (BID) | ORAL | 0 refills | Status: DC

## 2016-06-14 NOTE — Patient Instructions (Signed)

## 2016-06-14 NOTE — Telephone Encounter (Signed)
Patient thinks she may have a bacterial infection. Offered appointment today but she declined says she would rather speak with nurse.

## 2016-06-14 NOTE — Telephone Encounter (Addendum)
Returned call to patient. Patient states that she thinks she may have a bacterial infection. States she is easily prone to bacterial infections and has to be careful what types of soaps and detergents she uses. Patient states that a week ago she began feeling an increase in urinary urgency and that she always "felt like she had to pee." Patient states slight burning with urination, but that it is more pressure and discomfort that she is feeling. Denies any vaginal drainage.   Office visit offered. Patient agreeable. Scheduled for 08/03 with Johny Shock, CNM at [phone]. Patient agreeable to date and time and aware to arrive 15 minutes early.   Routing to provider for review. Will close encounter.

## 2016-06-14 NOTE — Progress Notes (Signed)
50 y.o. Married Caucasian female [id number] here with complaint of UTI, with onset  on last 24 hours. Patient complaining of urinary frequency/urgency/ and pain with urination. Patient denies fever, chills, nausea or back pain. Used new personal product of dial soap and had some vaginal itching, no discharge change Patient feels questionable related  to sexual activity. Sexually active 3 days ago with spotting light pink scant amount. Denies any vaginal symptoms.    Contraception is none, her choice. Periods are every 2-3 months. Peri menopausal. Patient drinking adequate water intake. No other health issues today. Has established with Dr. Ronnald Ramp for hypothyroid management. Has thyroid nodule under evaluation.Jenetta Downer: Healthy female WDWN Affect: Normal, orientation x 3 Skin : warm and dry CVAT: slightly positive on left only  Abdomen: positive  for suprapubic tenderness  Pelvic exam: External genital area: normal, no lesions Bladder,Urethra tender, Urethral meatus: tender,no redness Vagina: scant vaginal discharge, slight odor normal appearance   Affirm taken Cervix: normal, non tender Uterus:normal,non tender Adnexa: normal non tender, no fullness or masses   A: UTI Normal pelvic exam poct urine- rbc tr R/O vaginal infection vs vaginal dryness P: Reviewed findings of UTI and need for treatment. Rx: Bactrim see order with instructions [id number] micro, culture Reviewed warning signs and symptoms of UTI and need to advise if occurring. Encouraged to limit soda, tea, and coffee and be sure to increase water intake. Discussed will treat vaginally if indicated. Discussed coconut oil use for vaginally dryness, patient had been using vaseline. Discussed with vaseline use. Questions addressed.   RV prn

## 2016-06-15 ENCOUNTER — Telehealth: Payer: Self-pay

## 2016-06-15 ENCOUNTER — Telehealth: Payer: Self-pay | Admitting: Nurse Practitioner

## 2016-06-15 ENCOUNTER — Other Ambulatory Visit: Payer: Self-pay | Admitting: Certified Nurse Midwife

## 2016-06-15 DIAGNOSIS — B9689 Other specified bacterial agents as the cause of diseases classified elsewhere: Secondary | ICD-10-CM

## 2016-06-15 DIAGNOSIS — N760 Acute vaginitis: Principal | ICD-10-CM

## 2016-06-15 LAB — URINALYSIS, MICROSCOPIC ONLY
BACTERIA UA: NONE SEEN [HPF]
CASTS: NONE SEEN [LPF]
CRYSTALS: NONE SEEN [HPF]
RBC / HPF: NONE SEEN RBC/HPF (ref <=2)
YEAST: NONE SEEN [HPF]

## 2016-06-15 LAB — WET PREP BY MOLECULAR PROBE
Candida species: NEGATIVE
Gardnerella vaginalis: POSITIVE — AB
Trichomonas vaginosis: NEGATIVE

## 2016-06-15 MED ORDER — METRONIDAZOLE 500 MG PO TABS: 500.0000 mg | ORAL_TABLET | Freq: Two times a day (BID) | ORAL | 0 refills | Status: DC

## 2016-06-15 MED ORDER — METRONIDAZOLE 0.75 % VA GEL: 1.0000 | Freq: Two times a day (BID) | VAGINAL | 0 refills | Status: DC

## 2016-06-15 NOTE — Progress Notes (Signed)
Encounter reviewed Geremiah Fussell, MD   

## 2016-06-15 NOTE — Telephone Encounter (Signed)
-----   Message from Regina Eck, CNM sent at 06/15/2016  7:33 AM EDT ----- Notify patient that urine micro showed only WBC which are probably coming from vagina. Urine culture pending Affirm was positive for Bv only Order placed for Metrogel give instructions

## 2016-06-15 NOTE — Telephone Encounter (Signed)
lmtcb

## 2016-06-15 NOTE — Telephone Encounter (Signed)
Patient calling to possibly get a prescription change because of cost.

## 2016-06-15 NOTE — Telephone Encounter (Signed)
Patient notified of results. See lab 

## 2016-06-15 NOTE — Telephone Encounter (Signed)
Spoke with patient. Patient states that she went to pick up rx for Metrogel and it is going to cost over $100. Requesting an alternative medication. Rx for Flagyl 500 mg bid x 7 days #14 0RF sent to pharmacy on file. ETOH precautions given.  Routing to provider for final review. Patient agreeable to disposition. Will close encounter.

## 2016-06-17 LAB — URINE CULTURE

## 2016-06-18 ENCOUNTER — Telehealth: Payer: Self-pay | Admitting: Certified Nurse Midwife

## 2016-06-18 NOTE — Telephone Encounter (Signed)
Please see result note to the pt.

## 2016-06-18 NOTE — Telephone Encounter (Signed)
Patient calling to see if her results are in yet.

## 2016-06-18 NOTE — Telephone Encounter (Signed)
Spoke with patient. Advised of message as seen below from [name], [place]. Patient is agreeable and verbalizes understanding. Reports she is no longer having symptoms. She will return call if symptoms return.  Notes Recorded by [name], FNP on 06/18/2016 at 1:29 PM EDT Results via my chart:  Cynthia Wolf, The urine culture did show an infection and the medications that [name] is the correct one to treat the infection. Finish all the medication and still drink a lot of water. If symptoms return or persist please let us know.  Routing to provider for final review. Patient agreeable to disposition. Will close encounter.

## 2016-06-18 NOTE — Telephone Encounter (Signed)
Patient is calling regarding her recent urine culture results from 06/14/2016. Urine culture has returned and shows staphylococcus saprophyticus. Patient was started on Bactrim DS on 06/14/2016.   Routing to [name], FNP for review and advise as Melvia Heaps CNM is out of the office today.

## 2016-06-19 ENCOUNTER — Telehealth: Payer: Self-pay | Admitting: Certified Nurse Midwife

## 2016-06-19 NOTE — Telephone Encounter (Signed)
Patient called requesting to speak with the nurse. She has questions about Bactrim.

## 2016-06-19 NOTE — Telephone Encounter (Signed)
Spoke with patient. Patient states that 3-4 hours after she takes Bactrim she begins to have a headache, nausea and chills. Denies having these symptoms before starting Bactrim. Patient has taken 4 tablets so far. Reports she skipped a day of taking the medication to see how she felt. States that the day she did not take anything she felt better. She then took another dose of the Bactrim and had the same symptoms. "I feel very sluggish and not like myself." Advised patient I will speak with the provider and return call with further recommendations she is agreeable and verbalizes understanding.

## 2016-06-20 NOTE — Telephone Encounter (Signed)
Joy check on patient status with medication.

## 2016-06-21 NOTE — Telephone Encounter (Signed)
The flagyl is a good choice, because it will treat the BV and can treat other bacteria. This may have been the source of her urinary frequency and pain. Have her not restart bactrim and do Flagyl. Be sure and take with food.

## 2016-06-21 NOTE — Telephone Encounter (Signed)
Pt aware to not restart the bactrim & start the flagyl. Pt agrees with recommendations & will take flagy with food. Pt to call if any problems.

## 2016-06-21 NOTE — Telephone Encounter (Signed)
Pt states she did not take the bactrim yesterday or today & she feels better. Pt states she only has taken 4 of them & every time she did it made her have chills to her bones & discomfort around her shoulder blades. Pt states she had to lay down because she felt so bad. Pt says her urinary frequency is not as bad as before so even though it made her feel bad she states the 4 pills she took hopefully has helped with the symptoms. Please advise on what patient needs to do since she no longer wants to take the bactrim.  Also pt states she was given flagyl to take which she has never taken before & is a little worried to take that since she felt so bad taking the bactrim. Pt is going out of town with her husband & wont be back until 06-13-16. Pt was given the flagyl instead of metrogel because the metrogel was 100 dollars. Pt asking if there is a cheaper gel or cream that she can use instead. Please advise.

## 2016-07-02 ENCOUNTER — Telehealth: Payer: Self-pay | Admitting: Certified Nurse Midwife

## 2016-07-02 NOTE — Telephone Encounter (Signed)
Patient called asking to speak with Cynthia Wolf regarding her antibiotic. No current message.  Patient states she had a rash reaction to antibiotic and requests change to amoxicillin. States she has a uti and knows that she doesn't have reactions to amoxicillin.  Please call patients emergency contact number listed with Cynthia Wolf as her phone is not working.

## 2016-07-02 NOTE — Telephone Encounter (Signed)
Spoke with patient. Patient states that she contacted the office to report that she had a reaction to Bactrim which she was placed on for a UTI (please see note dated 06/19/2016). States she had a rash which lasted 4 days and bone aches with chills. She was advised to start Flagyl for treatment of BV and bacteria in urine and to discontinue Bactrim. Patient did not start Flagyl due to fear of reaction. Reports last night she developed pressure with urination. Denies lower back pain, fever, or chills. Patient states she is very concerned to start Flagyl as she has never taken this before and is worried about having a reaction. Asking if she may start a different medication such as Amoxicillin. Advised this medication is not prescribed for what needs to be treated. Patient would like me to speak with the provider to see if there is any other alternative she could try.   Routing to [name], FNP for review and advise.

## 2016-07-04 MED ORDER — AMOXICILLIN 250 MG PO CAPS: 250.0000 mg | ORAL_CAPSULE | Freq: Four times a day (QID) | ORAL | 0 refills | Status: DC

## 2016-07-04 NOTE — Telephone Encounter (Signed)
Spoke with patient. Advised I have spoken with Melvia Heaps CNM who recommends that we start the patient on Amoxicillin 250 mg qid x 5 days #20 0RF. Patient is agreeable and verbalizes understanding. Reports she has taken Amoxicillin before with no side effects. Rx sent to pharmacy on file. She is aware she will need to contact the office if symptoms persist or worsen with taking medication. Follow up appointment scheduled for 07/18/2016 at 11 am with Melvia Heaps CNM. She is agreeable to date and time.  Routing to provider for final review.

## 2016-07-04 NOTE — Telephone Encounter (Signed)
agree

## 2016-07-13 ENCOUNTER — Telehealth: Payer: Self-pay | Admitting: Nurse Practitioner

## 2016-07-13 ENCOUNTER — Ambulatory Visit (INDEPENDENT_AMBULATORY_CARE_PROVIDER_SITE_OTHER): Payer: BLUE CROSS/BLUE SHIELD | Admitting: Nurse Practitioner

## 2016-07-13 ENCOUNTER — Encounter: Payer: Self-pay | Admitting: Nurse Practitioner

## 2016-07-13 VITALS — BP 140/88 | HR 74 | Resp 16 | Ht 61.25 in | Wt 150.6 lb

## 2016-07-13 DIAGNOSIS — N760 Acute vaginitis: Secondary | ICD-10-CM | POA: Diagnosis not present

## 2016-07-13 MED ORDER — TERCONAZOLE 0.8 % VA CREA: 1.0000 | TOPICAL_CREAM | Freq: Every day | VAGINAL | 0 refills | Status: DC

## 2016-07-13 NOTE — Progress Notes (Signed)
50 y.o. Married Caucasian female G1P1001 here with complaint of vaginal symptoms of itching, and red. Denies discharge and odor. Treated for UTI with Amoxil started on 07/06/16.  She had a bad reaction to Bactrim and then changed to Amoxil. Onset of symptoms 2 days ago. Denies new personal products or vaginal dryness. None STD concerns. Urinary symptoms some urgency, frequency . Contraception is none secondary to infertility. LMP :  She had a cycle in maybe February.  Since then she has spotted for a few days only but unsure if it was 2-3 months ago.  She will discuss with Ms. Debbie on recheck visit next week.  O:  Healthy female WDWN Affect: normal, orientation x 3  Exam: no distress Abdomen: soft and non tender Lymph node: no enlargement or tenderness Pelvic exam: External genital: normal female, vulvar irritation BUS: negative Vagina: white thick discharge noted.  Affirm taken.   A: Vaginitis - most likely yeast  UTI - currently on Amoxil  Perimenopausal c/w irregular menses - may need a Provera challenge  History on Fibromyalgia with a current flare   P: Discussed findings of vaginitis and etiology. Discussed Aveeno or baking soda sitz bath for comfort. Avoid moist clothes or pads for extended period of time. If working out in gym clothes or swim suits for long periods of time change underwear or bottoms of swimsuit if possible. Olive Oil/Coconut Oil use for skin protection prior to activity can be used to external skin.  Rx: will go ahead and treat her with Terazol 3 and that way when she comes for recheck on UTI and Vagitis next week should be clear  Follow with Affirm  RV prn

## 2016-07-13 NOTE — Telephone Encounter (Signed)
Patient was seen today for a yeast infection and her prescription will not be ready until tomorrow. Patient is asking if she could use Monistat. Patient is asking if Monistat would cure this infection? Patient said you may leave details on her voicemail.

## 2016-07-13 NOTE — Telephone Encounter (Signed)
Rx for Terazol 3 was sent in today at patient's OV with [name], FNP for treatment of yeast. Please see OV note. Patient would like to know if she may use OTC Monistat and then use Terazol 3. Advised patient she may use OTC Monistat 3, but would not need to use Terazol 3 following as Monistat will treat yeast. Patient states that she prefers to wait until tomorrow to start Terzol 3. Will return call to the office with any further questions.  Routing to provider for final review. Patient agreeable to disposition. Will close encounter.

## 2016-07-13 NOTE — Patient Instructions (Signed)

## 2016-07-14 LAB — WET PREP BY MOLECULAR PROBE
Candida species: POSITIVE — AB
GARDNERELLA VAGINALIS: POSITIVE — AB
TRICHOMONAS VAG: NEGATIVE

## 2016-07-15 ENCOUNTER — Encounter: Payer: Self-pay | Admitting: Nurse Practitioner

## 2016-07-15 ENCOUNTER — Other Ambulatory Visit: Payer: Self-pay | Admitting: Nurse Practitioner

## 2016-07-15 MED ORDER — METRONIDAZOLE 0.75 % VA GEL: 1.0000 | Freq: Every day | VAGINAL | 0 refills | Status: DC

## 2016-07-15 NOTE — Progress Notes (Signed)
See result note about Terazol vs. Diflucan.

## 2016-07-18 ENCOUNTER — Encounter: Payer: Self-pay | Admitting: Certified Nurse Midwife

## 2016-07-18 ENCOUNTER — Ambulatory Visit (INDEPENDENT_AMBULATORY_CARE_PROVIDER_SITE_OTHER): Payer: BLUE CROSS/BLUE SHIELD | Admitting: Certified Nurse Midwife

## 2016-07-18 VITALS — BP 122/78 | HR 70 | Resp 16 | Ht 61.25 in | Wt 150.0 lb

## 2016-07-18 DIAGNOSIS — N390 Urinary tract infection, site not specified: Secondary | ICD-10-CM

## 2016-07-18 DIAGNOSIS — B373 Candidiasis of vulva and vagina: Secondary | ICD-10-CM

## 2016-07-18 DIAGNOSIS — B3731 Acute candidiasis of vulva and vagina: Secondary | ICD-10-CM

## 2016-07-18 NOTE — Patient Instructions (Signed)

## 2016-07-18 NOTE — Progress Notes (Signed)
50 y.o. Married Caucasian female G1P1001 here for follow up of UTI treated with Amoxicillin initiated on 06/14/16. Completed all medication as directed.  Denies any symptoms of urinary frequency, urgency or pain. Yeast symptoms seem gone now after using terazol cream. Has not used Metrogel yet for BV. Requests repeat affirm to see if needed. Last period in past 2 months, aware she needs to call if  No period in 3 months. No other health  Issues today.  POCT urine negative  O: Healthy WD,WN female Affect: normal, orientation x 3 Skin:warm and dry CVAT: negative bilateral Abdomen:soft, non tender, negative suprapubic Pelvic exam:EXTERNAL GENITALIA: normal appearing vulva with no masses, tenderness or lesions Urethra, bladder, urethral meatus non tender VAGINA: no abnormal discharge or lesions, affirm taken(per patient request) CERVIX: no lesions or cervical motion tenderness and normal appearance UTERUS: mid and non tender, no masses, normal size ADNEXA: no masses palpable, nontender and normal adnexa  A:UTI resolved Yeast vaginitis treated BV patient did not treat, non symptomatic  Amenorrhea? Greater than 3 months  P: Discussed findings of normal pelvic exam and no signs of UTI or vaginal infection. Will treat per affirm if indicated. Patient feels she has had period in past 3 months. Given calendar to record and discussed importance of monitoring. She is aware as from previous discussion and will call if no period in 3 months from now. Questions addressed.  Rv prn

## 2016-07-18 NOTE — Progress Notes (Signed)
Encounter reviewed by Dr. Bona Hubbard Amundson C. Silva.  

## 2016-07-19 LAB — WET PREP BY MOLECULAR PROBE
Candida species: NEGATIVE
Gardnerella vaginalis: NEGATIVE
Trichomonas vaginosis: NEGATIVE

## 2016-07-20 LAB — POCT URINALYSIS DIPSTICK
Bilirubin, UA: NEGATIVE
Blood, UA: NEGATIVE
Glucose, UA: NEGATIVE
Ketones, UA: NEGATIVE
LEUKOCYTES UA: NEGATIVE
NITRITE UA: NEGATIVE
PH UA: 5
PROTEIN UA: NEGATIVE
Urobilinogen, UA: NEGATIVE

## 2016-07-20 NOTE — Progress Notes (Signed)
Encounter reviewed Kashonda Sarkisyan, MD   

## 2016-07-23 ENCOUNTER — Ambulatory Visit (INDEPENDENT_AMBULATORY_CARE_PROVIDER_SITE_OTHER): Payer: BLUE CROSS/BLUE SHIELD | Admitting: Nurse Practitioner

## 2016-07-23 ENCOUNTER — Encounter: Payer: Self-pay | Admitting: Nurse Practitioner

## 2016-07-23 ENCOUNTER — Telehealth: Payer: Self-pay | Admitting: Internal Medicine

## 2016-07-23 ENCOUNTER — Other Ambulatory Visit (INDEPENDENT_AMBULATORY_CARE_PROVIDER_SITE_OTHER): Payer: BLUE CROSS/BLUE SHIELD

## 2016-07-23 VITALS — BP 190/108 | HR 86 | Temp 98.3°F | Wt 148.1 lb

## 2016-07-23 DIAGNOSIS — E038 Other specified hypothyroidism: Secondary | ICD-10-CM

## 2016-07-23 DIAGNOSIS — M2540 Effusion, unspecified joint: Secondary | ICD-10-CM

## 2016-07-23 DIAGNOSIS — M797 Fibromyalgia: Secondary | ICD-10-CM

## 2016-07-23 DIAGNOSIS — L509 Urticaria, unspecified: Secondary | ICD-10-CM

## 2016-07-23 DIAGNOSIS — M2560 Stiffness of unspecified joint, not elsewhere classified: Secondary | ICD-10-CM

## 2016-07-23 DIAGNOSIS — I10 Essential (primary) hypertension: Secondary | ICD-10-CM

## 2016-07-23 LAB — COMPREHENSIVE METABOLIC PANEL
ALT: 14 U/L (ref 0–35)
AST: 18 U/L (ref 0–37)
Albumin: 4.2 g/dL (ref 3.5–5.2)
Alkaline Phosphatase: 84 U/L (ref 39–117)
BILIRUBIN TOTAL: 0.5 mg/dL (ref 0.2–1.2)
BUN: 8 mg/dL (ref 6–23)
CALCIUM: 9 mg/dL (ref 8.4–10.5)
CHLORIDE: 102 meq/L (ref 96–112)
CO2: 28 meq/L (ref 19–32)
CREATININE: 0.63 mg/dL (ref 0.40–1.20)
GFR: 107.31 mL/min (ref >60.00)
Glucose, Bld: 105 mg/dL — ABNORMAL HIGH (ref 70–99)
Potassium: 4.1 mEq/L (ref 3.5–5.1)
SODIUM: 136 meq/L (ref 135–145)
Total Protein: 8.1 g/dL (ref 6.0–8.3)

## 2016-07-23 LAB — CBC WITH DIFFERENTIAL/PLATELET
BASOS ABS: 0.1 10*3/uL (ref 0.0–0.1)
Basophils Relative: 0.5 % (ref 0.0–3.0)
EOS ABS: 0 10*3/uL (ref 0.0–0.7)
Eosinophils Relative: 0.3 % (ref 0.0–5.0)
HEMATOCRIT: 44 % (ref 36.0–46.0)
HEMOGLOBIN: 15.2 g/dL — AB (ref 12.0–15.0)
LYMPHS PCT: 12.5 % (ref 12.0–46.0)
Lymphs Abs: 1.3 10*3/uL (ref 0.7–4.0)
MCHC: 34.5 g/dL (ref 30.0–36.0)
MCV: 83.8 fl (ref 78.0–100.0)
MONO ABS: 0.4 10*3/uL (ref 0.1–1.0)
Monocytes Relative: 3.8 % (ref 3.0–12.0)
NEUTROS ABS: 8.9 10*3/uL — AB (ref 1.4–7.7)
Neutrophils Relative %: 82.9 % — ABNORMAL HIGH (ref 43.0–77.0)
PLATELETS: 182 10*3/uL (ref 150.0–400.0)
RBC: 5.25 Mil/uL — ABNORMAL HIGH (ref 3.87–5.11)
RDW: 13.6 % (ref 11.5–15.5)
WBC: 10.7 10*3/uL — AB (ref 4.0–10.5)

## 2016-07-23 LAB — T4, FREE: FREE T4: 1.16 ng/dL (ref 0.60–1.60)

## 2016-07-23 LAB — TSH: TSH: 3.29 u[IU]/mL (ref 0.35–4.50)

## 2016-07-23 MED ORDER — CLONIDINE HCL 0.1 MG PO TABS: 0.2000 mg | ORAL_TABLET | Freq: Once | ORAL | Status: DC

## 2016-07-23 NOTE — Assessment & Plan Note (Addendum)
TSH and free T4 normal Continue current dose of Synthroid 100 g. Follow-up with pcp in 3 months.

## 2016-07-23 NOTE — Progress Notes (Signed)
Subjective:  Patient ID: Cynthia Wolf, female    DOB: 07-06-1966  Age: 50 y.o. MRN: [id number]  CC: Fibromyalgia (PT COMPLAINS OF: ITCHING CHEST, INNER THIGH, ABD AREA, FEET. )   Cynthia Wolf presents for itching, joint pain and stiffness, muscle pain.  Rash  This is a new problem. The current episode started in the past 7 days. The problem is unchanged. The rash is diffuse. The rash is characterized by itchiness. She was exposed to nothing. Associated symptoms include fatigue and joint pain. Pertinent negatives include no anorexia, congestion, cough, diarrhea, eye pain, fever, nail changes, rhinorrhea, shortness of breath, sore throat or vomiting. Past treatments include nothing. The treatment provided moderate relief. There is no history of allergies, asthma, eczema or varicella.   Joint pains, fatigue and muscle pain are chronic symptoms, ongoing for over 1 year, worse in the last 3 months. Denies any family history of rheumatoid arthritis or lupus. Joint stiffness in the morning lasting less than 105minutes. Degree of severity does not worsen throughout the day. Denies any fever or joint swelling.  Past of osteoarthritis and degenerative disc disease. While in [place] she was treated by rheumatologist, who diagnosed her with fibromyalgia. unable to remember medications prescribed. She is reviewed requesting another referral to rheumatologist. She is not taking gabapentin or Cymbalta as prescribed.  HTN: She keeps insisting that her blood pressure readings are normal at home. She denies any headaches chest pain, palpitation, shortness of breath or edema. She has not been taking Benicar and HCTZ as prescribed by Dr. Ronnald Ramp.  Outpatient Medications Prior to Visit  Medication Sig Dispense Refill  . DULoxetine (CYMBALTA) 60 MG capsule Take 1 capsule (60 mg total) by mouth daily. 90 capsule 0  . gabapentin (NEURONTIN) 100 MG capsule Take 1 capsule (100 mg total) by mouth 3 (three)  times daily. Increase up to 300 mg  Tid  As tolerated  After a week 90 capsule 1  . levothyroxine (SYNTHROID, LEVOTHROID) 100 MCG tablet Take 1 tablet (100 mcg total) by mouth daily. 90 tablet 1  . metaxalone (SKELAXIN) 800 MG tablet Take 1 tablet (800 mg total) by mouth 3 (three) times daily. 90 tablet 0  . Naproxen Sodium (ALEVE PO) Take by mouth.    . tapentadol (NUCYNTA) 50 MG TABS tablet Take 1 tablet (50 mg total) by mouth 2 (two) times daily as needed. 60 tablet 0  . telmisartan (MICARDIS) 80 MG tablet     . metroNIDAZOLE (METROGEL) 0.75 % vaginal gel Place 1 Applicatorful vaginally at bedtime. (Patient not taking: Reported on 07/18/2016) 70 g 0  . predniSONE (DELTASONE) 10 MG tablet      No facility-administered medications prior to visit.     ROS See HPI  Objective:  BP (!) 190/108   Pulse 86   Temp 98.3 F (36.8 C)   Wt 148 lb 1.3 oz (67.2 kg)   LMP 10/02/2015   SpO2 96%   BMI 27.75 kg/m   BP Readings from Last 3 Encounters:  07/23/16 (!) 190/108  07/18/16 122/78  07/13/16 140/88    Wt Readings from Last 3 Encounters:  07/23/16 148 lb 1.3 oz (67.2 kg)  07/18/16 150 lb (68 kg)  07/13/16 150 lb 9.6 oz (68.3 kg)    Physical Exam  Constitutional: She is oriented to person, place, and time. No distress.  HENT:  Right Ear: External ear normal.  Left Ear: External ear normal.  Nose: Nose normal.  Mouth/Throat: Oropharynx is clear and moist. No  oropharyngeal exudate.  Eyes: No scleral icterus.  Neck: Normal range of motion. Neck supple.  Cardiovascular: Normal rate and regular rhythm.   Pulmonary/Chest: Effort normal.  Musculoskeletal: Normal range of motion. She exhibits no edema.       Right wrist: Normal.       Left wrist: Normal.       Right hand: She exhibits tenderness. Normal sensation noted. Normal strength noted.       Left hand: She exhibits tenderness.  Lymphadenopathy:    She has no cervical adenopathy.  Neurological: She is alert and oriented to  person, place, and time.  Normal gait  Skin: Skin is warm and dry. Rash noted. Rash is urticarial.     Vitals reviewed.   Lab Results  Component Value Date   WBC 10.7 (H) 07/23/2016   HGB 15.2 (H) 07/23/2016   HCT 44.0 07/23/2016   PLT 182.0 07/23/2016   GLUCOSE 105 (H) 07/23/2016   CHOL 234 (H) 12/02/2015   TRIG 95 12/02/2015   HDL 86 12/02/2015   LDLCALC 129 12/02/2015   ALT 14 07/23/2016   AST 18 07/23/2016   NA 136 07/23/2016   K 4.1 07/23/2016   CL 102 07/23/2016   CREATININE 0.63 07/23/2016   BUN 8 07/23/2016   CO2 28 07/23/2016   TSH 3.29 07/23/2016   HGBA1C 5.6 03/05/2016    US Soft Tissue Head/neck  Result Date: 03/08/2016 CLINICAL DATA:  Neck swelling EXAM: THYROID ULTRASOUND TECHNIQUE: Ultrasound examination of the thyroid gland and adjacent soft tissues was performed. COMPARISON:  None. FINDINGS: Right thyroid lobe Measurements: 4.3 x 1.3 x 1.5 cm. Heterogeneous glandular tissue without focal nodule. Left thyroid lobe Measurements: 3.3 x 0.8 x 1.0 cm. Heterogeneous glandular tissue. 1.5 x 0.6 x 1.0 cm ill-defined mid lobe nodule versus pseudo nodule with central calcifications. Isthmus Thickness: 2 mm.  No nodules visualized. Lymphadenopathy None visualized. IMPRESSION: Solitary left mid lobe 1.5 cm nodule versus pseudo nodule. Findings meet consensus criteria for biopsy. Ultrasound-guided fine needle aspiration should be considered, as per the consensus statement: Management of Thyroid Nodules Detected at [place]: Society of Radiologists in [place]. Radiology 2005; [id number]. Electronically Signed   By: Marybelle Killings M.D.   On: 03/08/2016 16:23    Assessment & Plan:   Cynthia Wolf was seen today for fibromyalgia.  Diagnoses and all orders for this visit:  Other specified hypothyroidism -     TSH; Future -     T4, free; Future  Joint stiffness -     CBC w/Diff; Future -     Comprehensive metabolic panel; Future -     Ambulatory  referral to Rheumatology  Painful swelling of joint -     CBC w/Diff; Future -     Comprehensive metabolic panel; Future -     Ambulatory referral to Rheumatology  Hives of unknown origin -     CBC w/Diff; Future -     Comprehensive metabolic panel; Future  Essential hypertension -     Discontinue: cloNIDine (CATAPRES) tablet 0.2 mg; Take 2 tablets (0.2 mg total) by mouth once.  Fibromyalgia   I have discontinued Cynthia Wolf's predniSONE and metroNIDAZOLE. I am also having her maintain her DULoxetine, metaxalone, tapentadol, levothyroxine, gabapentin, telmisartan, and Naproxen Sodium (ALEVE PO).  Meds ordered this encounter  Medications  . DISCONTD: cloNIDine (CATAPRES) tablet 0.2 mg    Follow-up: Return in about 1 week (around 07/30/2016) for HTN.  Wilfred Lacy, NP

## 2016-07-23 NOTE — Assessment & Plan Note (Signed)
She declined clonidine dose and office. Encouraged patient to start Benicar HCTZ today. Follow-up in 1week

## 2016-07-23 NOTE — Patient Instructions (Signed)
Patient declined Clonidine dose in office. Resume BP medication today. Return to office in 1week for HTN and bring BP machine.  Go to lab for blood draw. May use claritin or benadryl or zyrtec for hives and itching.  Resume gabapentin And cymbalta as prescribed.   Hypertension Hypertension, commonly called high blood pressure, is when the force of blood pumping through your arteries is too strong. Your arteries are the blood vessels that carry blood from your heart throughout your body. A blood pressure reading consists of a higher number over a lower number, such as 110/72. The higher number (systolic) is the pressure inside your arteries when your heart pumps. The lower number (diastolic) is the pressure inside your arteries when your heart relaxes. Ideally you want your blood pressure below 120/80. Hypertension forces your heart to work harder to pump blood. Your arteries may become narrow or stiff. Having untreated or uncontrolled hypertension can cause heart attack, stroke, kidney disease, and other problems. RISK FACTORS Some risk factors for high blood pressure are controllable. Others are not.  Risk factors you cannot control include:   Race. You may be at higher risk if you are African American.  Age. Risk increases with age.  Gender. Men are at higher risk than women before age 25 years. After age 81, women are at higher risk than men. Risk factors you can control include:  Not getting enough exercise or physical activity.  Being overweight.  Getting too much fat, sugar, calories, or salt in your diet.  Drinking too much alcohol. SIGNS AND SYMPTOMS Hypertension does not usually cause signs or symptoms. Extremely high blood pressure (hypertensive crisis) may cause headache, anxiety, shortness of breath, and nosebleed. DIAGNOSIS To check if you have hypertension, your health care provider will measure your blood pressure while you are seated, with your arm held at the level  of your heart. It should be measured at least twice using the same arm. Certain conditions can cause a difference in blood pressure between your right and left arms. A blood pressure reading that is higher than normal on one occasion does not mean that you need treatment. If it is not clear whether you have high blood pressure, you may be asked to return on a different day to have your blood pressure checked again. Or, you may be asked to monitor your blood pressure at home for 1 or more weeks. TREATMENT Treating high blood pressure includes making lifestyle changes and possibly taking medicine. Living a healthy lifestyle can help lower high blood pressure. You may need to change some of your habits. Lifestyle changes may include:  Following the DASH diet. This diet is high in fruits, vegetables, and whole grains. It is low in salt, red meat, and added sugars.  Keep your sodium intake below 2,300 mg per day.  Getting at least 30-45 minutes of aerobic exercise at least 4 times per week.  Losing weight if necessary.  Not smoking.  Limiting alcoholic beverages.  Learning ways to reduce stress. Your health care provider may prescribe medicine if lifestyle changes are not enough to get your blood pressure under control, and if one of the following is true:  You are 91-36 years of age and your systolic blood pressure is above 140.  You are 9 years of age or older, and your systolic blood pressure is above 150.  Your diastolic blood pressure is above 90.  You have diabetes, and your systolic blood pressure is over [id number] or your diastolic blood  pressure is over 90.  You have kidney disease and your blood pressure is above 140/90.  You have heart disease and your blood pressure is above 140/90. Your personal target blood pressure may vary depending on your medical conditions, your age, and other factors. HOME CARE INSTRUCTIONS  Have your blood pressure rechecked as directed by your health  care provider.   Take medicines only as directed by your health care provider. Follow the directions carefully. Blood pressure medicines must be taken as prescribed. The medicine does not work as well when you skip doses. Skipping doses also puts you at risk for problems.  Do not smoke.   Monitor your blood pressure at home as directed by your health care provider. SEEK MEDICAL CARE IF:   You think you are having a reaction to medicines taken.  You have recurrent headaches or feel dizzy.  You have swelling in your ankles.  You have trouble with your vision. SEEK IMMEDIATE MEDICAL CARE IF:  You develop a severe headache or confusion.  You have unusual weakness, numbness, or feel faint.  You have severe chest or abdominal pain.  You vomit repeatedly.  You have trouble breathing. MAKE SURE YOU:   Understand these instructions.  Will watch your condition.  Will get help right away if you are not doing well or get worse.   This information is not intended to replace advice given to you by your health care provider. Make sure you discuss any questions you have with your health care provider.   Document Released: 10/29/2005 Document Revised: 03/15/2015 Document Reviewed: 08/21/2013 Elsevier Interactive Patient Education [employer].

## 2016-07-23 NOTE — Progress Notes (Signed)
Abnormal: CMP and thyroid function are normal. Maintain current synthroid dose. CBC indicate mild increase in WBC. No medication needed at this time. Need to repeat cbc during next office visit.

## 2016-07-23 NOTE — Telephone Encounter (Signed)
Patient Name: Cynthia Wolf  DOB: 1966-10-31    Initial Comment Caller states she has welts all over and itching. The ones in her inner thigh are flat.    Nurse Assessment  Nurse: Verlin Fester RN, Stanton Kidney Date/Time (Eastern Time): 07/23/2016 7:53:05 AM  Confirm and document reason for call. If symptomatic, describe symptoms. You must click the next button to save text entered. ---Patient states she has a rash on her back, legs, elbow and her body itches  Has the patient traveled out of the country within the last 30 days? ---No  Does the patient have any new or worsening symptoms? ---Yes  Will a triage be completed? ---Yes  Related visit to physician within the last 2 weeks? ---No  Does the PT have any chronic conditions? (i.e. diabetes, asthma, etc.) ---Yes  List chronic conditions. ---"thyroid, fibromyalgia and osteoarthritis"  Is the patient pregnant or possibly pregnant? (Ask all females between the ages of 72-55) ---No  Is this a behavioral health or substance abuse call? ---No     Guidelines    Guideline Title Affirmed Question Affirmed Notes  Rash or Redness - Widespread SEVERE itching (i.e., interferes with sleep, normal activities or school)    Final Disposition User   See Physician within [place], RN, [facility]    Comments  Call disconnected before I could get appt. Attempted to call back X 2 and no answer left message for her to call back for appt at her convenience   Referrals  REFERRED TO PCP OFFICE   Disagree/Comply: Comply   Unable to contact patient for appt.

## 2016-07-23 NOTE — Assessment & Plan Note (Addendum)
Patient requested referral to rheumatology. Patient advised to resume use of gabapentin and Cymbalta.

## 2016-08-06 ENCOUNTER — Telehealth: Payer: Self-pay | Admitting: Internal Medicine

## 2016-08-06 NOTE — Telephone Encounter (Signed)
Patient called referring to the rheumatology referral. She is specifically asking that we get her in with Dr. Gavin Pound  Dr. Gavin Pound, MDWebsiteDirections 5.0 2 Google reviews Rheumatologist in [place], [place] Address: [address], [place], [place] [address] Hours: Open today  8AM-4:30PM Phone: [phone]

## 2016-08-06 NOTE — Telephone Encounter (Signed)
Faxed referral to [facility] Rhe[facility], Dr. Gavin Pound office

## 2016-09-13 ENCOUNTER — Telehealth: Payer: Self-pay | Admitting: Internal Medicine

## 2016-09-13 ENCOUNTER — Emergency Department (HOSPITAL_BASED_OUTPATIENT_CLINIC_OR_DEPARTMENT_OTHER)
Admit: 2016-09-13 | Discharge: 2016-09-13 | Disposition: A | Discharge status: Home / Self Care | Payer: BLUE CROSS/BLUE SHIELD

## 2016-09-13 ENCOUNTER — Encounter (HOSPITAL_COMMUNITY): Payer: Self-pay | Admitting: *Deleted

## 2016-09-13 ENCOUNTER — Emergency Department (HOSPITAL_COMMUNITY)
Admission: EM | Admit: 2016-09-13 | Discharge: 2016-09-13 | Disposition: A | Discharge status: Home / Self Care | Payer: BLUE CROSS/BLUE SHIELD | Attending: Emergency Medicine | Admitting: Emergency Medicine

## 2016-09-13 DIAGNOSIS — M79602 Pain in left arm: Secondary | ICD-10-CM | POA: Insufficient documentation

## 2016-09-13 DIAGNOSIS — E039 Hypothyroidism, unspecified: Secondary | ICD-10-CM | POA: Diagnosis not present

## 2016-09-13 DIAGNOSIS — R42 Dizziness and giddiness: Secondary | ICD-10-CM | POA: Diagnosis not present

## 2016-09-13 DIAGNOSIS — F17210 Nicotine dependence, cigarettes, uncomplicated: Secondary | ICD-10-CM | POA: Diagnosis not present

## 2016-09-13 DIAGNOSIS — M79609 Pain in unspecified limb: Secondary | ICD-10-CM | POA: Diagnosis not present

## 2016-09-13 DIAGNOSIS — I10 Essential (primary) hypertension: Secondary | ICD-10-CM | POA: Diagnosis not present

## 2016-09-13 NOTE — ED Notes (Signed)
Pt is in stable condition upon d/c and ambulates from ED. 

## 2016-09-13 NOTE — Telephone Encounter (Signed)
[facility] Patient Name: Cynthia Wolf DOB: 10-20-1966 Initial Comment Caller states had blood drawn 10/26; arm hurting since, getting harder to bend her arm; bicep seems to be swollen; lower arm begins to tingle like going to sleep with pressure; can move and bend arm; husband thinks could be collapsed vein. Nurse Assessment Nurse: Markus Daft, RN, Sherre Poot Date/Time (Eastern Time): 09/13/2016 9:19:02 AM Confirm and document reason for call. If symptomatic, describe symptoms. You must click the next button to save text entered. ---Caller states had blood drawn 09/06/16 with butterfly needle to get 10 vials of blood, and arm hurting since. It is getting harder to bend her arm, and bicep seems to be swollen. Her lower arm begins to tingle like going to sleep with pressure. She can move and bend arm. Husband thinks could be collapsed vein. Has the patient traveled out of the country within the last 30 days? ---Not Applicable Does the patient have any new or worsening symptoms? ---Yes Will a triage be completed? ---Yes Related visit to physician within the last 2 weeks? ---Yes Does the PT have any chronic conditions? (i.e. diabetes, asthma, etc.) ---Yes List chronic conditions. ---fibromyalgia, osteoarthritis Is the patient pregnant or possibly pregnant? (Ask all females between the ages of 52-55) ---No Is this a behavioral health or substance abuse call? ---No Guidelines Guideline Title Affirmed Question Affirmed Notes IV Site (Skin) Symptoms Patient sounds very sick or weak to the triager Final Disposition User Go to ED Now (or PCP triage) Markus Daft, RN, Sherre Poot Comments Denies redness, swelling right at site of needle put in. Upper arm only is swollen. Gradually swelling, and painful when bending. RN will see if any available appts soon at office. - Nothing available at office, and so RN advised that  she go on to ER then. Caller verb. understanding. Referrals REFERRED TO PCP OFFICE Disagree/Comply: Comply

## 2016-09-13 NOTE — ED Provider Notes (Signed)
[facility] DEPT Provider Note   CSN: [id number] Arrival date & time: 09/13/16  1050     History   Chief Complaint Chief Complaint  Patient presents with  . Arm Pain    HPI Cynthia Wolf is a 50 y.o. female.  HPI   50 year old female with history of fibromyalgia, anxiety, depression, presenting with complaint of left arm pain. Patient states she had blood drawn from her left [facility] on 10/26. Since then report progressive worsening achy sharp pain to there L AC, spreading to her L upper arm with occasional tingling sensation down her L hand. States she is having difficulty lifting her arm and bending her arm and felt that her upper arm is swollen. Sxs of moderate in severity.  She has been using ice compress with minimal improvement. She denies having fever, chest pain, shortness of breath, productive cough, hemoptysis, left arm weakness, or dropping objects. Denies any prior history of DVT. She did reach out to her PCP who recommend patient to come to ER for further evaluation.  Past Medical History:  Diagnosis Date  . Anxiety   . Back injury   . Chicken pox   . Depression   . Endometriosis   . Fibroid    ?  Marland Kitchen Fibromyalgia   . GERD (gastroesophageal reflux disease)   . H/O blood clots    from heavy menstrual cycles. Had shot done to stop bleeding per patient  . Hyperlipidemia   . Hypertension   . Infertility, female   . Migraines   . Osteoarthritis   . PID (pelvic inflammatory disease)   . STD (sexually transmitted disease)    chlamydia  . Thyroid disease     Patient Active Problem List   Diagnosis Date Noted  . Visit for screening mammogram 04/17/2016  . Food allergy 04/17/2016  . Left thyroid nodule 04/17/2016  . Hypothyroidism 01/02/2016  . Fibromyalgia 01/02/2016  . Essential hypertension 01/02/2016  . Anxiety and depression 01/02/2016    Past Surgical History:  Procedure Laterality Date  . LAPAROSCOPY    . OVARIAN CYST SURGERY      OB History    Gravida Para Term Preterm AB Living   1 1 1     1    SAB TAB Ectopic Multiple Live Births                   Home Medications    Prior to Admission medications   Medication Sig Start Date End Date Taking? Authorizing Provider  DULoxetine (CYMBALTA) 60 MG capsule Take 1 capsule (60 mg total) by mouth daily. 01/02/16   Golden Circle, FNP  gabapentin (NEURONTIN) 100 MG capsule Take 1 capsule (100 mg total) by mouth 3 (three) times daily. Increase up to 300 mg  Tid  As tolerated  After a week 05/28/16   Burnis Medin, MD  levothyroxine (SYNTHROID, LEVOTHROID) 100 MCG tablet Take 1 tablet (100 mcg total) by mouth daily. 04/17/16   Janith Lima, MD  metaxalone (SKELAXIN) 800 MG tablet Take 1 tablet (800 mg total) by mouth 3 (three) times daily. 01/02/16   Golden Circle, FNP  Naproxen Sodium (ALEVE PO) Take by mouth.    Historical Provider, MD  tapentadol (NUCYNTA) 50 MG TABS tablet Take 1 tablet (50 mg total) by mouth 2 (two) times daily as needed. 03/05/16   Golden Circle, FNP  telmisartan (MICARDIS) 80 MG tablet  04/17/16   Historical Provider, MD    Family History Family History  Problem Relation Age of Onset  . Breast cancer Maternal Aunt   . Cancer Maternal Grandfather   . Arthritis Mother   . Hyperlipidemia Mother   . Stroke Mother   . Hypertension Mother   . Diabetes Mother   . Hyperlipidemia Father   . Other Maternal Uncle     tumor  . Leukemia Paternal Uncle     Social History Social History  Substance Use Topics  . Smoking status: Current Every Day Smoker    Years: 37.00    Types: Cigarettes    Start date: 04/17/2002  . Smokeless tobacco: Never Used  . Alcohol use 0.6 - 1.2 oz/week    1 - 2 Standard drinks or equivalent per week     Comment: socially     Allergies   Bactrim [sulfamethoxazole-trimethoprim]; Shellfish allergy; and Yellow dyes (non-tartrazine)   Review of Systems Review of Systems  All other systems reviewed and are negative.    Physical  Exam Updated Vital Signs BP 137/92 (BP Location: Left Arm)   Pulse 96   Temp 98.7 F (37.1 C) (Oral)   Resp 16   SpO2 100%   Physical Exam  Constitutional: She appears well-developed and well-nourished. No distress.  HENT:  Head: Atraumatic.  Eyes: Conjunctivae are normal.  Neck: Neck supple.  Cardiovascular: Normal rate, regular rhythm and intact distal pulses.   Pulmonary/Chest: Effort normal and breath sounds normal.  Musculoskeletal: She exhibits tenderness (Left arm: Mild tenderness noted to left AC with faint bruising. Normal elbow flexion and extension, upper arm and forearm compartments are soft).  Neurological: She is alert.  Skin: No rash noted.  Psychiatric: She has a normal mood and affect.  Nursing note and vitals reviewed.    ED Treatments / Results  Labs (all labs ordered are listed, but only abnormal results are displayed) Labs Reviewed - No data to display  EKG  EKG Interpretation None       Radiology No results found.  Procedures Procedures (including critical care time)  Medications Ordered in ED Medications - No data to display   Initial Impression / Assessment and Plan / ED Course  I have reviewed the triage vital signs and the nursing notes.  Pertinent labs & imaging results that were available during my care of the patient were reviewed by me and considered in my medical decision making (see chart for details).  Clinical Course    BP 112/66   Pulse 67   Temp 98.7 F (37.1 C) (Oral)   Resp 16   SpO2 100%    Final Clinical Impressions(s) / ED Diagnoses   Final diagnoses:  Left arm pain    New Prescriptions New Prescriptions   No medications on file   12:27 PM Patient presents with concerns of blood clot to her left arm after having blood drawn approximately a week ago. Examination is unremarkable, low suspicion for DVT. No evidence to suggest infection. However, due to her concern, plan to obtain a venous Doppler  ultrasound to rule out DVT.  3:24 PM Venous Doppler ultrasound of left upper extremity demonstrate no evidence of DVT or any other acute finding. Patient reassured. Rice therapy discussed. She is stable for discharge.   Domenic Moras, PA-C 09/13/16 [place], MD 09/13/16 [phone]

## 2016-09-13 NOTE — Progress Notes (Signed)
VASCULAR LAB PRELIMINARY  PRELIMINARY  PRELIMINARY  PRELIMINARY  Left upper extremity venous duplex completed.    Preliminary report:  There is no DVT or SVT noted in the left upper extremity.   Called report to Domenic Moras PA-C  [place], [place], RVT 09/13/2016, 2:52 PM

## 2016-09-13 NOTE — ED Notes (Signed)
Patient transported to Ultrasound 

## 2016-09-13 NOTE — ED Triage Notes (Signed)
Pt reports having blood drawn from left AC on 10/26 and having problems with left arm since then. Reports difficulty bending and lifting her left arm, feels like her upper arm is swollen. No swelling or bruising noted at triage. reports when lying her arm down for extended amount of time, she has numbness/tingling to lower arm.

## 2016-09-13 NOTE — Discharge Instructions (Signed)
You have been evaluated for your left elbow and left arm pain.  No evidence of blood clot in your left arm.  Take ibuprofen or tylenol as needed for pain.

## 2016-09-17 ENCOUNTER — Encounter: Payer: Self-pay | Admitting: Internal Medicine

## 2016-09-17 ENCOUNTER — Ambulatory Visit (INDEPENDENT_AMBULATORY_CARE_PROVIDER_SITE_OTHER): Payer: BLUE CROSS/BLUE SHIELD | Admitting: Internal Medicine

## 2016-09-17 VITALS — BP 150/90 | HR 87 | Temp 98.0°F | Resp 16 | Ht 61.25 in | Wt 150.5 lb

## 2016-09-17 DIAGNOSIS — E038 Other specified hypothyroidism: Secondary | ICD-10-CM

## 2016-09-17 DIAGNOSIS — R200 Anesthesia of skin: Secondary | ICD-10-CM

## 2016-09-17 DIAGNOSIS — R202 Paresthesia of skin: Secondary | ICD-10-CM

## 2016-09-17 DIAGNOSIS — I10 Essential (primary) hypertension: Secondary | ICD-10-CM

## 2016-09-17 NOTE — Patient Instructions (Signed)
Hypertension Hypertension, commonly called high blood pressure, is when the force of blood pumping through your arteries is too strong. Your arteries are the blood vessels that carry blood from your heart throughout your body. A blood pressure reading consists of a higher number over a lower number, such as 110/72. The higher number (systolic) is the pressure inside your arteries when your heart pumps. The lower number (diastolic) is the pressure inside your arteries when your heart relaxes. Ideally you want your blood pressure below 120/80. Hypertension forces your heart to work harder to pump blood. Your arteries may become narrow or stiff. Having untreated or uncontrolled hypertension can cause heart attack, stroke, kidney disease, and other problems. RISK FACTORS Some risk factors for high blood pressure are controllable. Others are not.  Risk factors you cannot control include:   Race. You may be at higher risk if you are African American.  Age. Risk increases with age.  Gender. Men are at higher risk than women before age 45 years. After age 65, women are at higher risk than men. Risk factors you can control include:  Not getting enough exercise or physical activity.  Being overweight.  Getting too much fat, sugar, calories, or salt in your diet.  Drinking too much alcohol. SIGNS AND SYMPTOMS Hypertension does not usually cause signs or symptoms. Extremely high blood pressure (hypertensive crisis) may cause headache, anxiety, shortness of breath, and nosebleed. DIAGNOSIS To check if you have hypertension, your health care provider will measure your blood pressure while you are seated, with your arm held at the level of your heart. It should be measured at least twice using the same arm. Certain conditions can cause a difference in blood pressure between your right and left arms. A blood pressure reading that is higher than normal on one occasion does not mean that you need treatment. If  it is not clear whether you have high blood pressure, you may be asked to return on a different day to have your blood pressure checked again. Or, you may be asked to monitor your blood pressure at home for 1 or more weeks. TREATMENT Treating high blood pressure includes making lifestyle changes and possibly taking medicine. Living a healthy lifestyle can help lower high blood pressure. You may need to change some of your habits. Lifestyle changes may include:  Following the DASH diet. This diet is high in fruits, vegetables, and whole grains. It is low in salt, red meat, and added sugars.  Keep your sodium intake below 2,300 mg per day.  Getting at least 30-45 minutes of aerobic exercise at least 4 times per week.  Losing weight if necessary.  Not smoking.  Limiting alcoholic beverages.  Learning ways to reduce stress. Your health care provider may prescribe medicine if lifestyle changes are not enough to get your blood pressure under control, and if one of the following is true:  You are 18-59 years of age and your systolic blood pressure is above 140.  You are 60 years of age or older, and your systolic blood pressure is above 150.  Your diastolic blood pressure is above 90.  You have diabetes, and your systolic blood pressure is over 140 or your diastolic blood pressure is over 90.  You have kidney disease and your blood pressure is above 140/90.  You have heart disease and your blood pressure is above 140/90. Your personal target blood pressure may vary depending on your medical conditions, your age, and other factors. HOME CARE INSTRUCTIONS    Have your blood pressure rechecked as directed by your health care provider.   Take medicines only as directed by your health care provider. Follow the directions carefully. Blood pressure medicines must be taken as prescribed. The medicine does not work as well when you skip doses. Skipping doses also puts you at risk for  problems.  Do not smoke.   Monitor your blood pressure at home as directed by your health care provider. SEEK MEDICAL CARE IF:   You think you are having a reaction to medicines taken.  You have recurrent headaches or feel dizzy.  You have swelling in your ankles.  You have trouble with your vision. SEEK IMMEDIATE MEDICAL CARE IF:  You develop a severe headache or confusion.  You have unusual weakness, numbness, or feel faint.  You have severe chest or abdominal pain.  You vomit repeatedly.  You have trouble breathing. MAKE SURE YOU:   Understand these instructions.  Will watch your condition.  Will get help right away if you are not doing well or get worse.   This information is not intended to replace advice given to you by your health care provider. Make sure you discuss any questions you have with your health care provider.   Document Released: 10/29/2005 Document Revised: 03/15/2015 Document Reviewed: 08/21/2013 Elsevier Interactive Patient Education 2016 Elsevier Inc.  

## 2016-09-17 NOTE — Progress Notes (Signed)
Pre visit review using our clinic review tool, if applicable. No additional management support is needed unless otherwise documented below in the visit note. 

## 2016-09-17 NOTE — Progress Notes (Signed)
Subjective:  Patient ID: Cynthia Wolf, female    DOB: Jun 03, 1966  Age: 50 y.o. MRN: [id number]  CC: Hypothyroidism   HPI Cynthia Wolf presents for f/up  - She tells me that she had phlebotomy done a couple weeks ago in her left antecubital fossa at a rheumatologist's office and she has had a number of symptoms since then. She was recently seen in the ER and an ultrasound of her left arm was done and it was negative for thrombophlebitis or deep venous thromboses. She complains of persistent symptoms up and down her left arm. She complains of numbness, tingling, pain. She has not noticed any bruising, redness, swelling, or wounds. She has been taking meloxicam for symptom relief.   Outpatient Medications Prior to Visit  Medication Sig Dispense Refill  . Ascorbic Acid (VITAMIN C) 1000 MG tablet Take 1,000 mg by mouth daily.    . cholecalciferol (VITAMIN D) 1000 units tablet Take 1,000 Units by mouth daily.    Marland Kitchen levothyroxine (SYNTHROID, LEVOTHROID) 100 MCG tablet Take 1 tablet (100 mcg total) by mouth daily. 90 tablet 1  . meloxicam (MOBIC) 15 MG tablet Take 15 mg by mouth daily.    . vitamin B-12 (CYANOCOBALAMIN) 1000 MCG tablet Take 1,000 mcg by mouth daily.    . DULoxetine (CYMBALTA) 60 MG capsule Take 1 capsule (60 mg total) by mouth daily. 90 capsule 0  . gabapentin (NEURONTIN) 100 MG capsule Take 1 capsule (100 mg total) by mouth 3 (three) times daily. Increase up to 300 mg  Tid  As tolerated  After a week 90 capsule 1  . metaxalone (SKELAXIN) 800 MG tablet Take 1 tablet (800 mg total) by mouth 3 (three) times daily. 90 tablet 0  . tapentadol (NUCYNTA) 50 MG TABS tablet Take 1 tablet (50 mg total) by mouth 2 (two) times daily as needed. 60 tablet 0   No facility-administered medications prior to visit.     ROS Review of Systems  Constitutional: Negative for activity change, appetite change, chills, fatigue and fever.  HENT: Negative.   Eyes: Negative for visual disturbance.    Respiratory: Negative for cough, choking, chest tightness, shortness of breath and stridor.   Cardiovascular: Negative for chest pain, palpitations and leg swelling.  Gastrointestinal: Negative for abdominal pain.  Endocrine: Negative.   Genitourinary: Negative.   Musculoskeletal: Positive for arthralgias and myalgias. Negative for joint swelling, neck pain and neck stiffness.  Skin: Negative.  Negative for color change, rash and wound.  Allergic/Immunologic: Negative.   Neurological: Positive for numbness. Negative for dizziness, weakness and headaches.  Hematological: Negative.  Negative for adenopathy. Does not bruise/bleed easily.  Psychiatric/Behavioral: Negative.     Objective:  BP (!) 150/90 (BP Location: Right Arm, Patient Position: Sitting, Cuff Size: Normal)   Pulse 87   Temp 98 F (36.7 C) (Oral)   Resp 16   Ht 5' 1.25" (1.556 m)   Wt 150 lb 8 oz (68.3 kg)   SpO2 97%   BMI 28.21 kg/m   BP Readings from Last 3 Encounters:  09/17/16 (!) 150/90  09/13/16 117/68  07/23/16 (!) 190/108    Wt Readings from Last 3 Encounters:  09/17/16 150 lb 8 oz (68.3 kg)  07/23/16 148 lb 1.3 oz (67.2 kg)  07/18/16 150 lb (68 kg)    Physical Exam  Constitutional: No distress.  HENT:  Mouth/Throat: Oropharynx is clear and moist. No oropharyngeal exudate.  Eyes: Conjunctivae are normal. Right eye exhibits no discharge. Left eye exhibits  no discharge. No scleral icterus.  Neck: Normal range of motion. Neck supple. No JVD present. No tracheal deviation present. No thyromegaly present.  Cardiovascular: Normal rate, regular rhythm, normal heart sounds and intact distal pulses.  Exam reveals no gallop and no friction rub.   No murmur heard. Pulmonary/Chest: Effort normal and breath sounds normal. No stridor. No respiratory distress. She has no wheezes. She has no rales. She exhibits no tenderness.  Abdominal: Soft. Bowel sounds are normal. She exhibits no distension and no mass. There is  no tenderness. There is no rebound and no guarding.  Musculoskeletal: Normal range of motion. She exhibits no edema, tenderness or deformity.       Left shoulder: Normal.       Left elbow: Normal. She exhibits normal range of motion, no swelling, no effusion, no deformity and no laceration. No tenderness found.       Left wrist: Normal.       Cervical back: Normal.  Lymphadenopathy:    She has no cervical adenopathy.  Neurological: She has normal strength. She displays no atrophy, no tremor and normal reflexes. No cranial nerve deficit or sensory deficit. She exhibits normal muscle tone. She displays a negative Romberg sign. She displays no seizure activity. Coordination and gait normal.  Reflex Scores:      Tricep reflexes are 1+ on the right side and 1+ on the left side.      Bicep reflexes are 1+ on the right side and 1+ on the left side.      Brachioradialis reflexes are 1+ on the right side and 1+ on the left side.      Patellar reflexes are 1+ on the right side and 1+ on the left side.      Achilles reflexes are 1+ on the right side and 1+ on the left side. Skin: Skin is warm and dry. No rash noted. She is not diaphoretic. No erythema. No pallor.  Vitals reviewed.   Lab Results  Component Value Date   WBC 10.7 (H) 07/23/2016   HGB 15.2 (H) 07/23/2016   HCT 44.0 07/23/2016   PLT 182.0 07/23/2016   GLUCOSE 105 (H) 07/23/2016   CHOL 234 (H) 12/02/2015   TRIG 95 12/02/2015   HDL 86 12/02/2015   LDLCALC 129 12/02/2015   ALT 14 07/23/2016   AST 18 07/23/2016   NA 136 07/23/2016   K 4.1 07/23/2016   CL 102 07/23/2016   CREATININE 0.63 07/23/2016   BUN 8 07/23/2016   CO2 28 07/23/2016   TSH 3.29 07/23/2016   HGBA1C 5.6 03/05/2016    No results found.  Assessment & Plan:   Tyleen was seen today for hypothyroidism.  Diagnoses and all orders for this visit:  Numbness and tingling in left arm- exam is normal, will get a NCS/EMG to see if there is a neurological cause for  her symptoms -     Ambulatory referral to Neurology  Essential hypertension- her BP is well controlled on no meds  Other specified hypothyroidism- recent TSH was in the normal range, cont on the same dose   I have discontinued Ms. Pender's DULoxetine, metaxalone, tapentadol, and gabapentin. I am also having her maintain her levothyroxine, meloxicam, vitamin C, cholecalciferol, and vitamin B-12.  No orders of the defined types were placed in this encounter.    Follow-up: Return in about 3 months (around 12/18/2016).  Scarlette Calico, MD

## 2016-09-20 ENCOUNTER — Other Ambulatory Visit: Payer: Self-pay | Admitting: *Deleted

## 2016-09-20 DIAGNOSIS — R200 Anesthesia of skin: Secondary | ICD-10-CM

## 2016-09-25 ENCOUNTER — Ambulatory Visit (INDEPENDENT_AMBULATORY_CARE_PROVIDER_SITE_OTHER): Payer: BLUE CROSS/BLUE SHIELD | Admitting: Neurology

## 2016-09-25 DIAGNOSIS — R200 Anesthesia of skin: Secondary | ICD-10-CM | POA: Diagnosis not present

## 2016-09-25 NOTE — Procedures (Signed)
[facility]  [place]  [place], [place] [address] Tel: [phone] Fax:  [phone] Test Date:  09/25/2016  Patient: Cynthia Wolf DOB: [birth year] Physician: Narda Amber, DO  Sex: Female Height: 5\' 0"  Ref Phys: Scarlette Calico, M.D.  ID#: [id number] Temp: 32.4C Technician: Jerilynn Mages. Dean   Patient Complaints: This is a 50 year old female referred for evaluation of left arm pain and paresthesias following phlebotomy approximately 3 weeks ago.  NCV & EMG Findings: Extensive electrodiagnostic testing of the left upper extremity shows:  1. All sensory responses including the left median, ulnar, medial antebrachial, lateral antebrachial, and mixed palmar sensory responses are within normal limits.  2. All motor responses including the left median and ulnar nerves are within normal limits.  3. There is no evidence of active or chronic motor axon loss changes affecting any of the tested muscles. Motor unit configuration and recruitment pattern is within normal limits.   Impression: This is a normal study of the left upper extremity.    ___________________________ Narda Amber, DO    Nerve Conduction Studies Anti Sensory Summary Table   Stim Site NR Peak (ms) Norm Peak (ms) P-T Amp (V) Norm P-T Amp  Left Lat Ante Brach Cutan Anti Sensory (Lat Forearm)  32.4C  Lat Biceps    1.9 <2.9 21.6 >14  Left Med Ante Brach Cutan Anti Sensory (Med Forearm)  32.4C  Elbow    2.5  11.8   Left Median Anti Sensory (2nd Digit)  Wrist    2.9 <3.4 38.6 >20  Left Ulnar Anti Sensory (5th Digit)  Wrist    2.8 <3.1 30.1 >12   Motor Summary Table   Stim Site NR Onset (ms) Norm Onset (ms) O-P Amp (mV) Norm O-P Amp Site1 Site2 Delta-0 (ms) Dist (cm) Vel (m/s) Norm Vel (m/s)  Left Median Motor (Abd Poll Brev)  Wrist    2.7 <3.9 10.4 >6 Elbow Wrist 3.2 21.0 66 >50  Elbow    5.9  10.3         Left Ulnar Motor (Abd Dig Minimi)  Wrist    2.3 <3.1 11.1 >7 B Elbow Wrist 2.2 15.0 68  >50  B Elbow    4.5  10.6  A Elbow B Elbow 1.4 10.0 71 >50  A Elbow    5.9  10.5          Comparison Summary Table   Stim Site NR Peak (ms) Norm Peak (ms) P-T Amp (V) Site1 Site2 Delta-P (ms) Norm Delta (ms)  Left Median/Ulnar Palm Comparison (Wrist - 8cm)  Median Palm    1.8 <2.2 82.2 Median Palm Ulnar Palm 0.1   Ulnar Palm    1.7 <2.2 25.6       EMG   Side Muscle Ins Act Fibs Psw Fasc Number Recrt Dur Dur. Amp Amp. Poly Poly. Comment  Left 1stDorInt Nml Nml Nml Nml Nml Nml Nml Nml Nml Nml Nml Nml N/A  Left Ext Indicis Nml Nml Nml Nml Nml Nml Nml Nml Nml Nml Nml Nml N/A  Left PronatorTeres Nml Nml Nml Nml Nml Nml Nml Nml Nml Nml Nml Nml N/A  Left Biceps Nml Nml Nml Nml Nml Nml Nml Nml Nml Nml Nml Nml N/A  Left Triceps Nml Nml Nml Nml Nml Nml Nml Nml Nml Nml Nml Nml N/A  Left Deltoid Nml Nml Nml Nml Nml Nml Nml Nml Nml Nml Nml Nml N/A      Waveforms:

## 2016-11-20 ENCOUNTER — Telehealth: Payer: Self-pay | Admitting: Internal Medicine

## 2016-11-20 MED ORDER — LEVOTHYROXINE SODIUM 100 MCG PO TABS: 100.0000 ug | ORAL_TABLET | Freq: Every day | ORAL | 1 refills | Status: DC

## 2016-11-20 NOTE — Telephone Encounter (Signed)
Pt request refill for levothyroxine (SYNTHROID, LEVOTHROID) 100 MCG tablet send to new pharmacy: [facility] on [address].

## 2016-11-20 NOTE — Telephone Encounter (Signed)
Refill has been sent.../lmb 

## 2016-12-04 ENCOUNTER — Ambulatory Visit: Payer: BLUE CROSS/BLUE SHIELD | Admitting: Certified Nurse Midwife

## 2016-12-12 ENCOUNTER — Ambulatory Visit: Payer: BLUE CROSS/BLUE SHIELD | Admitting: Certified Nurse Midwife

## 2016-12-13 ENCOUNTER — Emergency Department (HOSPITAL_COMMUNITY)
Admission: EM | Admit: 2016-12-13 | Discharge: 2016-12-13 | Disposition: A | Discharge status: Home / Self Care | Payer: BLUE CROSS/BLUE SHIELD | Attending: Emergency Medicine | Admitting: Emergency Medicine

## 2016-12-13 ENCOUNTER — Emergency Department (HOSPITAL_COMMUNITY): Payer: BLUE CROSS/BLUE SHIELD

## 2016-12-13 ENCOUNTER — Encounter (HOSPITAL_COMMUNITY): Payer: Self-pay | Admitting: Family Medicine

## 2016-12-13 DIAGNOSIS — S92511A Displaced fracture of proximal phalanx of right lesser toe(s), initial encounter for closed fracture: Secondary | ICD-10-CM | POA: Diagnosis not present

## 2016-12-13 DIAGNOSIS — E039 Hypothyroidism, unspecified: Secondary | ICD-10-CM | POA: Diagnosis not present

## 2016-12-13 DIAGNOSIS — Y92009 Unspecified place in unspecified non-institutional (private) residence as the place of occurrence of the external cause: Secondary | ICD-10-CM | POA: Insufficient documentation

## 2016-12-13 DIAGNOSIS — S92501A Displaced unspecified fracture of right lesser toe(s), initial encounter for closed fracture: Secondary | ICD-10-CM

## 2016-12-13 DIAGNOSIS — F17210 Nicotine dependence, cigarettes, uncomplicated: Secondary | ICD-10-CM | POA: Diagnosis not present

## 2016-12-13 DIAGNOSIS — Y999 Unspecified external cause status: Secondary | ICD-10-CM | POA: Diagnosis not present

## 2016-12-13 DIAGNOSIS — W2203XA Walked into furniture, initial encounter: Secondary | ICD-10-CM | POA: Diagnosis not present

## 2016-12-13 DIAGNOSIS — Y9301 Activity, walking, marching and hiking: Secondary | ICD-10-CM | POA: Insufficient documentation

## 2016-12-13 DIAGNOSIS — I10 Essential (primary) hypertension: Secondary | ICD-10-CM | POA: Insufficient documentation

## 2016-12-13 DIAGNOSIS — S99921A Unspecified injury of right foot, initial encounter: Secondary | ICD-10-CM | POA: Diagnosis present

## 2016-12-13 MED ORDER — IBUPROFEN 800 MG PO TABS: 800.0000 mg | ORAL_TABLET | Freq: Three times a day (TID) | ORAL | 0 refills | Status: DC

## 2016-12-13 MED ORDER — HYDROCODONE-ACETAMINOPHEN 5-325 MG PO TABS: 1.0000 | ORAL_TABLET | Freq: Four times a day (QID) | ORAL | 0 refills | Status: DC | PRN

## 2016-12-13 NOTE — ED Triage Notes (Addendum)
Pt presents from home via POV with c/o right foot pain that began Saturday when she "jammed" her toes against a sofa by accident. She notes significant pain, and mild bruising.  She has not taken any medications to help with pain. PT wearing surgical shoe borrowed from a family member. PT in NAD.

## 2016-12-13 NOTE — ED Notes (Signed)
Pt instructed not to eat or drink until evaluation is complete

## 2016-12-13 NOTE — ED Provider Notes (Signed)
[facility] DEPT Provider Note    By signing my name below, I, Bea Graff, attest that this documentation has been prepared under the direction and in the presence of Domenic Moras, PA-C. Electronically Signed: Bea Graff, ED Scribe. 12/13/16. 5:33 PM.    History   Chief Complaint Chief Complaint  Patient presents with  . Foot Pain    The history is provided by the patient and medical records. No language interpreter was used.    Cynthia Wolf is a 51 y.o. female with PMHx of osteoarthritis who presents to the Emergency Department complaining of severe throbbing right foot pain that began five days ago. She states she hit her toes on a piece of furniture while walking through the house quickly. She reports associated swelling and states it is hard to ambulate. She has not taken anything for pain but has iced and elevated the area. Bearing weight and ambulating increase her pain. Pt denies alleviating factors. She denies numbness, tingling or weakness of the right foot or RLE. She states she has fractured this foot in the past.    Past Medical History:  Diagnosis Date  . Anxiety   . Back injury   . Chicken pox   . Depression   . Endometriosis   . Fibroid    ?  Marland Kitchen Fibromyalgia   . GERD (gastroesophageal reflux disease)   . H/O blood clots    from heavy menstrual cycles. Had shot done to stop bleeding per patient  . Hyperlipidemia   . Hypertension   . Infertility, female   . Migraines   . Osteoarthritis   . PID (pelvic inflammatory disease)   . STD (sexually transmitted disease)    chlamydia  . Thyroid disease     Patient Active Problem List   Diagnosis Date Noted  . Numbness and tingling in left arm 09/17/2016  . Visit for screening mammogram 04/17/2016  . Food allergy 04/17/2016  . Left thyroid nodule 04/17/2016  . Hypothyroidism 01/02/2016  . Fibromyalgia 01/02/2016  . Essential hypertension 01/02/2016  . Anxiety and depression 01/02/2016     Past Surgical History:  Procedure Laterality Date  . LAPAROSCOPY    . OVARIAN CYST SURGERY      OB History    Gravida Para Term Preterm AB Living   1 1 1     1    SAB TAB Ectopic Multiple Live Births                   Home Medications    Prior to Admission medications   Medication Sig Start Date End Date Taking? Authorizing Provider  Ascorbic Acid (VITAMIN C) 1000 MG tablet Take 1,000 mg by mouth daily.    Historical Provider, MD  cholecalciferol (VITAMIN D) 1000 units tablet Take 1,000 Units by mouth daily.    Historical Provider, MD  levothyroxine (SYNTHROID, LEVOTHROID) 100 MCG tablet Take 1 tablet (100 mcg total) by mouth daily. 11/20/16   Janith Lima, MD  meloxicam (MOBIC) 15 MG tablet Take 15 mg by mouth daily. 09/06/16   Historical Provider, MD  vitamin B-12 (CYANOCOBALAMIN) 1000 MCG tablet Take 1,000 mcg by mouth daily.    Historical Provider, MD    Family History Family History  Problem Relation Age of Onset  . Breast cancer Maternal Aunt   . Cancer Maternal Grandfather   . Arthritis Mother   . Hyperlipidemia Mother   . Stroke Mother   . Hypertension Mother   . Diabetes Mother   .  Hyperlipidemia Father   . Other Maternal Uncle     tumor  . Leukemia Paternal Uncle     Social History Social History  Substance Use Topics  . Smoking status: Current Every Day Smoker    Years: 37.00    Types: Cigarettes    Start date: 04/17/2002  . Smokeless tobacco: Never Used  . Alcohol use 0.6 - 1.2 oz/week    1 - 2 Standard drinks or equivalent per week     Comment: socially     Allergies   Bactrim [sulfamethoxazole-trimethoprim]; Shellfish allergy; and Yellow dyes (non-tartrazine)   Review of Systems Review of Systems  Musculoskeletal: Positive for arthralgias.  Skin: Positive for color change. Negative for wound.  Neurological: Negative for weakness and numbness.     Physical Exam Updated Vital Signs BP 159/95 (BP Location: Left Arm)   Pulse 95    Temp 98.1 F (36.7 C) (Oral)   Resp 18   Ht 5' (1.524 m)   Wt 145 lb (65.8 kg)   LMP 11/24/2016   SpO2 100%   BMI 28.32 kg/m   Physical Exam  Constitutional: She is oriented to person, place, and time. She appears well-developed and well-nourished.  HENT:  Head: Normocephalic and atraumatic.  Neck: Normal range of motion.  Cardiovascular: Normal rate.   Good cap refill of distal toes of right foot.  Pulmonary/Chest: Effort normal.  Musculoskeletal: Normal range of motion. She exhibits tenderness. She exhibits no edema or deformity.  Right foot with tenderness noted to base of third toe with faint ecchymosis overlying dorsum of 2nd-3rd-4th toes and no gross deformity. No nail involvement.  Neurological: She is alert and oriented to person, place, and time.  Skin: Skin is warm and dry.  Psychiatric: She has a normal mood and affect. Her behavior is normal.  Nursing note and vitals reviewed.    ED Treatments / Results  DIAGNOSTIC STUDIES: Oxygen Saturation is 100% on RA, normal by my interpretation.   COORDINATION OF CARE: 5:21 PM- Will X-Ray right foot. Pt verbalizes understanding and agrees to plan.  Medications - No data to display  Labs (all labs ordered are listed, but only abnormal results are displayed) Labs Reviewed - No data to display  EKG  EKG Interpretation None       Radiology Dg Foot Complete Right  Result Date: 12/13/2016 CLINICAL DATA:  Patient reports jamming her right foot into a couch saturday, patients right foot is bruised on the dorsal surface right below her 2nd, 3rd, and 4th toe. Patient reports pain and not being able to bear weight on her right foot. EXAM: RIGHT FOOT COMPLETE - 3+ VIEW COMPARISON:  None. FINDINGS: Minimally displaced fracture of the right third proximal phalanx. No malalignment at the adjacent MTP or IP joints. No other osseous fracture identified. IMPRESSION: Minimally displaced fracture of the right third proximal phalanx.  Electronically Signed   By: Franki Cabot M.D.   On: 12/13/2016 18:23    Procedures Procedures (including critical care time)  Medications Ordered in ED Medications - No data to display   Initial Impression / Assessment and Plan / ED Course  I have reviewed the triage vital signs and the nursing notes.  Pertinent labs & imaging results that were available during my care of the patient were reviewed by me and considered in my medical decision making (see chart for details).     BP 159/95 (BP Location: Left Arm)   Pulse 78   Temp 98.1 F (36.7  C) (Oral)   Resp 18   Ht 5' (1.524 m)   Wt 65.8 kg   LMP 11/24/2016   SpO2 100%   BMI 28.32 kg/m   I personally performed the services described in this documentation, which was scribed in my presence. The recorded information has been reviewed and is accurate.     Final Clinical Impressions(s) / ED Diagnoses   Final diagnoses:  Closed fracture of phalanx of right third toe, initial encounter    New Prescriptions New Prescriptions   No medications on file   6:40 PM Pt injured her R foot.  Xray demonstrate a minimally displaced fx of the R third proximal phalanx.  Pt is NVI.  Will place foot in postop shoe. Pain medication prescribed.  Ortho referral given.  RICE therapy discussed.    Domenic Moras, PA-C 12/13/16 1845    Gareth Morgan, MD 12/16/16 [phone]

## 2016-12-25 ENCOUNTER — Ambulatory Visit: Payer: BLUE CROSS/BLUE SHIELD | Admitting: Certified Nurse Midwife

## 2017-01-04 ENCOUNTER — Telehealth: Payer: Self-pay

## 2017-01-04 NOTE — Telephone Encounter (Signed)
Pt left 3 vm rq refills without stating which medications. One vm sounded as if she wanted refill of meloxicam. Not very audible.    Called pt back and no answer. Called pt back a second time and left detailed message.   LVM for pt to call back as soon as possible.  RE: Which medications? If narcotic, pt will need an appt as ED rx'ed hydrocodone.

## 2017-01-07 ENCOUNTER — Other Ambulatory Visit: Payer: Self-pay | Admitting: Internal Medicine

## 2017-01-07 DIAGNOSIS — M797 Fibromyalgia: Secondary | ICD-10-CM

## 2017-01-07 MED ORDER — MELOXICAM 15 MG PO TABS: 15.0000 mg | ORAL_TABLET | Freq: Every day | ORAL | 1 refills | Status: DC

## 2017-01-07 NOTE — Telephone Encounter (Signed)
LVM for pt to call back as soon as possible.   RE: need clarification of what pt is needing.  Please refer to note below.

## 2017-01-07 NOTE — Telephone Encounter (Signed)
Patient called back in.  States she was in the hospital and that neurologist wanted Dr. Ronnald Ramp to fill meloxicam.  Scheduled patient appt for wed at [facility].

## 2017-01-07 NOTE — Telephone Encounter (Signed)
RX sent

## 2017-01-08 ENCOUNTER — Ambulatory Visit: Payer: BLUE CROSS/BLUE SHIELD | Admitting: Certified Nurse Midwife

## 2017-01-09 ENCOUNTER — Inpatient Hospital Stay: Payer: BLUE CROSS/BLUE SHIELD | Admitting: Internal Medicine

## 2017-01-14 ENCOUNTER — Inpatient Hospital Stay: Payer: BLUE CROSS/BLUE SHIELD | Admitting: Internal Medicine

## 2017-05-17 DIAGNOSIS — E039 Hypothyroidism, unspecified: Secondary | ICD-10-CM | POA: Diagnosis not present

## 2017-05-17 DIAGNOSIS — R030 Elevated blood-pressure reading, without diagnosis of hypertension: Secondary | ICD-10-CM | POA: Diagnosis not present

## 2017-07-16 ENCOUNTER — Other Ambulatory Visit: Payer: Self-pay | Admitting: Internal Medicine

## 2017-10-25 DIAGNOSIS — E039 Hypothyroidism, unspecified: Secondary | ICD-10-CM | POA: Diagnosis not present

## 2017-10-25 DIAGNOSIS — M5441 Lumbago with sciatica, right side: Secondary | ICD-10-CM | POA: Diagnosis not present

## 2017-10-25 DIAGNOSIS — R030 Elevated blood-pressure reading, without diagnosis of hypertension: Secondary | ICD-10-CM | POA: Diagnosis not present

## 2018-01-21 DIAGNOSIS — Z0000 Encounter for general adult medical examination without abnormal findings: Secondary | ICD-10-CM | POA: Diagnosis not present

## 2018-03-07 DIAGNOSIS — N950 Postmenopausal bleeding: Secondary | ICD-10-CM | POA: Diagnosis not present

## 2018-03-07 DIAGNOSIS — I10 Essential (primary) hypertension: Secondary | ICD-10-CM | POA: Diagnosis not present

## 2018-03-07 DIAGNOSIS — M797 Fibromyalgia: Secondary | ICD-10-CM | POA: Diagnosis not present

## 2018-03-07 DIAGNOSIS — N760 Acute vaginitis: Secondary | ICD-10-CM | POA: Diagnosis not present

## 2018-03-07 DIAGNOSIS — Z01411 Encounter for gynecological examination (general) (routine) with abnormal findings: Secondary | ICD-10-CM | POA: Diagnosis not present

## 2018-03-18 DIAGNOSIS — R8290 Unspecified abnormal findings in urine: Secondary | ICD-10-CM | POA: Diagnosis not present

## 2019-04-21 ENCOUNTER — Other Ambulatory Visit: Payer: Self-pay

## 2019-04-21 ENCOUNTER — Ambulatory Visit
Admission: EM | Admit: 2019-04-21 | Discharge: 2019-04-21 | Disposition: A | Discharge status: Home / Self Care | Payer: 59 | Attending: Physician Assistant | Admitting: Physician Assistant

## 2019-04-21 ENCOUNTER — Encounter: Payer: Self-pay | Admitting: Emergency Medicine

## 2019-04-21 DIAGNOSIS — I10 Essential (primary) hypertension: Secondary | ICD-10-CM | POA: Diagnosis not present

## 2019-04-21 DIAGNOSIS — M546 Pain in thoracic spine: Secondary | ICD-10-CM | POA: Diagnosis not present

## 2019-04-21 DIAGNOSIS — S161XXA Strain of muscle, fascia and tendon at neck level, initial encounter: Secondary | ICD-10-CM

## 2019-04-21 MED ORDER — PREDNISONE 50 MG PO TABS: 50.0000 mg | ORAL_TABLET | Freq: Every day | ORAL | 0 refills | Status: DC

## 2019-04-21 MED ORDER — TIZANIDINE HCL 2 MG PO TABS: 2.0000 mg | ORAL_TABLET | Freq: Two times a day (BID) | ORAL | 0 refills | Status: DC | PRN

## 2019-04-21 NOTE — Discharge Instructions (Signed)
Start prednisone as directed. Tizanidine as needed, this can make you drowsy, so do not take if you are going to drive, operate heavy machinery, or make important decisions. Ice/heat compresses as needed. This can take up to 3-4 weeks to completely resolve, but you should be feeling better each week. Follow up with PCP/orthopedics for further evaluation needed.

## 2019-04-21 NOTE — ED Provider Notes (Signed)
[facility]    CSN: [id number] Arrival date & time: 04/21/19  1027     History   Chief Complaint Chief Complaint  Patient presents with  . Neck Pain    HPI Cynthia Wolf is a 53 y.o. female.   53 year old female comes in for 1 week history of left-sided neck pain/back pain.  Patient states for started out as neck strain, thought it was due to sleeping wrong/heavy lifting.  Tried hot compresses, OTC pain medication with mild relief.  She then try to use a deep massage machine, and states made the symptoms worse.  Now pain is to the left thoracic back/parascapular area, and occasionally has numbness/tingling to the left arm.  She denies loss of grip strength.  Denies injury/trauma.  Denies chest pain, shortness of breath, palpitation.  Denies fever, chills, night sweats.  Denies photophobia.     Past Medical History:  Diagnosis Date  . Anxiety   . Back injury   . Chicken pox   . Depression   . Endometriosis   . Fibroid    ?  Marland Kitchen Fibromyalgia   . GERD (gastroesophageal reflux disease)   . H/O blood clots    from heavy menstrual cycles. Had shot done to stop bleeding per patient  . Hyperlipidemia   . Hypertension   . Infertility, female   . Migraines   . Osteoarthritis   . PID (pelvic inflammatory disease)   . STD (sexually transmitted disease)    chlamydia  . Thyroid disease     Patient Active Problem List   Diagnosis Date Noted  . Numbness and tingling in left arm 09/17/2016  . Visit for screening mammogram 04/17/2016  . Food allergy 04/17/2016  . Left thyroid nodule 04/17/2016  . Hypothyroidism 01/02/2016  . Fibromyalgia 01/02/2016  . Essential hypertension 01/02/2016  . Anxiety and depression 01/02/2016    Past Surgical History:  Procedure Laterality Date  . LAPAROSCOPY    . OVARIAN CYST SURGERY      OB History    Gravida  1   Para  1   Term  1   Preterm      AB      Living  1     SAB      TAB      Ectopic      Multiple       Live Births               Home Medications    Prior to Admission medications   Medication Sig Start Date End Date Taking? Authorizing Provider  Ascorbic Acid (VITAMIN C) 1000 MG tablet Take 1,000 mg by mouth daily.    [provider]  cholecalciferol (VITAMIN D) 1000 units tablet Take 1,000 Units by mouth daily.    [provider]  ibuprofen (ADVIL,MOTRIN) 800 MG tablet Take 1 tablet (800 mg total) by mouth 3 (three) times daily. 12/13/16   Domenic Moras, PA-C  levothyroxine (SYNTHROID, LEVOTHROID) 100 MCG tablet Take 1 tablet (100 mcg total) by mouth daily. 11/20/16   Janith Lima, MD  meloxicam (MOBIC) 15 MG tablet Take 1 tablet (15 mg total) by mouth daily. 01/07/17   Janith Lima, MD  predniSONE (DELTASONE) 50 MG tablet Take 1 tablet (50 mg total) by mouth daily with breakfast. 04/21/19   Tasia Catchings, Amy V, PA-C  tiZANidine (ZANAFLEX) 2 MG tablet Take 1 tablet (2 mg total) by mouth 2 (two) times daily as needed for muscle spasms. 04/21/19  Yu, Amy V, PA-C  vitamin B-12 (CYANOCOBALAMIN) 1000 MCG tablet Take 1,000 mcg by mouth daily.    [provider]    Family History Family History  Problem Relation Age of Onset  . Breast cancer Maternal Aunt   . Cancer Maternal Grandfather   . Arthritis Mother   . Hyperlipidemia Mother   . Stroke Mother   . Hypertension Mother   . Diabetes Mother   . Hyperlipidemia Father   . Other Maternal Uncle        tumor  . Leukemia Paternal Uncle     Social History Social History   Tobacco Use  . Smoking status: Current Every Day Smoker    Years: 37.00    Types: Cigarettes    Start date: 04/17/2002  . Smokeless tobacco: Never Used  Substance Use Topics  . Alcohol use: Yes    Alcohol/week: 1.0 - 2.0 standard drinks    Types: 1 - 2 Standard drinks or equivalent per week    Comment: socially  . Drug use: No     Allergies   Bactrim [sulfamethoxazole-trimethoprim]; Shellfish allergy; and Yellow dyes (non-tartrazine)    Review of Systems Review of Systems  Reason unable to perform ROS: See HPI as above.     Physical Exam Triage Vital Signs ED Triage Vitals  Enc Vitals Group     BP 04/21/19 1044 (!) 195/96     Pulse Rate 04/21/19 1044 70     Resp 04/21/19 1044 16     Temp 04/21/19 1044 98.3 F (36.8 C)     Temp Source 04/21/19 1044 Oral     SpO2 04/21/19 1044 98 %     Weight --      Height --      Head Circumference --      Peak Flow --      Pain Score 04/21/19 1045 8     Pain Loc --      Pain Edu? --      Excl. in [place]? --    No data found.  Updated Vital Signs BP (!) 195/96 (BP Location: Left Arm)   Pulse 70   Temp 98.3 F (36.8 C) (Oral)   Resp 16   SpO2 98%    Physical Exam Constitutional:      General: She is not in acute distress.    Appearance: She is well-developed. She is not ill-appearing, toxic-appearing or diaphoretic.  HENT:     Head: Normocephalic and atraumatic.  Eyes:     Conjunctiva/sclera: Conjunctivae normal.     Pupils: Pupils are equal, round, and reactive to light.  Neck:     Musculoskeletal: Normal range of motion and neck supple.     Comments: No tenderness to palpation of spinous processes.  Diffuse tenderness to palpation of left neck.  Decreased extension of the neck due to pain. Cardiovascular:     Rate and Rhythm: Normal rate and regular rhythm.     Heart sounds: Normal heart sounds. No murmur. No friction rub. No gallop.   Pulmonary:     Effort: Pulmonary effort is normal. No accessory muscle usage or respiratory distress.     Breath sounds: Normal breath sounds. No stridor. No decreased breath sounds, wheezing, rhonchi or rales.  Musculoskeletal:     Comments: No tenderness on palpation of the spinous processes.  Diffuse tenderness to palpation of left thoracic back.  Full range of motion of shoulder, elbow.  Strength normal and equal bilaterally.  Grip strength  normal and equal bilaterally.  Sensation intact and equal bilaterally.  Radial  pulse 2+, cap refill less than 2 seconds.   Skin:    General: Skin is warm and dry.  Neurological:     Mental Status: She is alert and oriented to person, place, and time.      UC Treatments / Results  Labs (all labs ordered are listed, but only abnormal results are displayed) Labs Reviewed - No data to display  EKG None  Radiology No results found.  Procedures Procedures (including critical care time)  Medications Ordered in UC Medications - No data to display  Initial Impression / Assessment and Plan / UC Course  I have reviewed the triage vital signs and the nursing notes.  Pertinent labs & imaging results that were available during my care of the patient were reviewed by me and considered in my medical decision making (see chart for details).    Start prednisone as directed.  Muscle relaxant as needed.  Return precautions given.  Patient expresses understanding and agrees with plan.  Final Clinical Impressions(s) / UC Diagnoses   Final diagnoses:  Strain of neck muscle, initial encounter  Acute left-sided thoracic back pain    ED Prescriptions    Medication Sig Dispense Auth. Provider   tiZANidine (ZANAFLEX) 2 MG tablet Take 1 tablet (2 mg total) by mouth 2 (two) times daily as needed for muscle spasms. 10 tablet Yu, Amy V, PA-C   predniSONE (DELTASONE) 50 MG tablet Take 1 tablet (50 mg total) by mouth daily with breakfast. 5 tablet Tobin Chad, [place] 04/21/19 1229

## 2019-04-21 NOTE — ED Triage Notes (Signed)
Patient complains of left sided neck pain and numbness and tingling in the left arm X 1 week. Patient has tried at home treatment - hot/cold packs, OTC pain medication.

## 2019-04-21 NOTE — ED Notes (Signed)
Patient able to ambulate independently  

## 2019-05-12 ENCOUNTER — Ambulatory Visit
Admission: RE | Admit: 2019-05-12 | Discharge: 2019-05-12 | Disposition: A | Discharge status: Home / Self Care | Payer: 59 | Source: Ambulatory Visit | Attending: Internal Medicine | Admitting: Internal Medicine

## 2019-05-12 ENCOUNTER — Other Ambulatory Visit: Payer: Self-pay | Admitting: Internal Medicine

## 2019-05-12 ENCOUNTER — Other Ambulatory Visit: Payer: Self-pay

## 2019-05-12 DIAGNOSIS — M542 Cervicalgia: Secondary | ICD-10-CM

## 2019-08-31 ENCOUNTER — Other Ambulatory Visit: Payer: Self-pay

## 2019-09-01 ENCOUNTER — Other Ambulatory Visit: Payer: Self-pay

## 2019-09-01 ENCOUNTER — Ambulatory Visit: Payer: 59 | Admitting: Certified Nurse Midwife

## 2019-09-01 ENCOUNTER — Encounter: Payer: Self-pay | Admitting: Certified Nurse Midwife

## 2019-09-01 VITALS — BP 120/78 | HR 64 | Temp 97.2°F | Resp 16 | Ht 61.25 in | Wt 150.0 lb

## 2019-09-01 DIAGNOSIS — N390 Urinary tract infection, site not specified: Secondary | ICD-10-CM

## 2019-09-01 DIAGNOSIS — N898 Other specified noninflammatory disorders of vagina: Secondary | ICD-10-CM | POA: Diagnosis not present

## 2019-09-01 DIAGNOSIS — Z113 Encounter for screening for infections with a predominantly sexual mode of transmission: Secondary | ICD-10-CM

## 2019-09-01 LAB — POCT URINALYSIS DIPSTICK
Bilirubin, UA: NEGATIVE
Blood, UA: NEGATIVE
Glucose, UA: NEGATIVE
Ketones, UA: NEGATIVE
Leukocytes, UA: NEGATIVE
Nitrite, UA: NEGATIVE
Protein, UA: NEGATIVE
Urobilinogen, UA: NEGATIVE E.U./dL — AB
pH, UA: 5 (ref 5.0–8.0)

## 2019-09-01 NOTE — Patient Instructions (Signed)

## 2019-09-01 NOTE — Progress Notes (Signed)
53 y.o. Married Caucasian female G1P1001 here to re-establish care for problem. She has stopped periods due to menopause. She is having burning when urination touches skin and feels itching. Has noted she was not hydrating well, and increased fluids which stopped the urine issues. She feels it is mainly vaginal irritation. She also would like vaginal STD screening. History of chlamydia in the past.  No discharge that she can see and no odor.Onset of symptoms 2-4 days ago. Denies new personal products or vaginal dryness or vaginal bleeding. Urinary symptoms none other than above. Was concerned it might have been the toilet paper that set this off. Saw new GYN last year for visit and all normal per patient. No other health issues today.    Review of Systems  Constitutional: Negative.   HENT: Negative.   Eyes: Negative.   Respiratory: Negative.   Cardiovascular: Negative.   Gastrointestinal: Negative.   Genitourinary: Negative for flank pain, frequency, hematuria and urgency.       Urinary burning with decrease fluid intake only  Musculoskeletal: Negative.   Skin: Positive for itching.       Vaginal area  Neurological: Negative.   Endo/Heme/Allergies: Negative.   Psychiatric/Behavioral: Negative.     O:Healthy female WDWN Affect: normal, orientation x 3  poct urine-neg Exam:Skin: warm and dry Abdomen: soft non tender, suprapubic tenderness negative  Inguinal Lymph nodes: no enlargement or tenderness Pelvic exam: External genital: normal female with increase pink and slight scaling noted on vulva bilateral BUS: negative Vagina: white/grey watery discharge noted. Affirm taken,  Cervix: normal, non tender, no CMT, GC,Chlamydia screening specimen collected Uterus: normal, non tender Adnexa:normal, non tender, no masses or fullness noted Rectal: normal appearance, no redness or hemorrhoid  A:Normal pelvic exam Menopausal R/O vaginal infection R/O STD screening   P:Discussed  findings of vaginal discharge and possible etiology. Discussed Aveeno sitz bath for comfort. Avoid moist clothes or pads for extended period of time. Will treat if indicated, once labs in. Questions addressed. Lab: Affirm, Gc,Chlamydia  Rv prn

## 2019-09-02 ENCOUNTER — Telehealth: Payer: Self-pay

## 2019-09-02 LAB — VAGINITIS/VAGINOSIS, DNA PROBE
Candida Species: NEGATIVE
Gardnerella vaginalis: POSITIVE — AB
Trichomonas vaginosis: NEGATIVE

## 2019-09-02 NOTE — Telephone Encounter (Signed)
Regina Eck, CNM  [place], CMA        Notify patient her vaginal screening was positive for BV, negative for yeast and trichomonas. She will need Rx Flagyl 500 mg bid x 7, avoid ETOH during treatment.  Gonorrhea and Chlamydia pending.     Left message for callback.

## 2019-09-03 LAB — GC/CHLAMYDIA PROBE AMP
Chlamydia trachomatis, NAA: NEGATIVE
Neisseria Gonorrhoeae by PCR: NEGATIVE

## 2019-09-03 NOTE — Telephone Encounter (Signed)
Patient returning call to nurse. Patient is going back to work and requests a returned all after 4:30pm today.

## 2019-09-03 NOTE — Telephone Encounter (Signed)
Left message to call Sharee Pimple, RN at [facility].    Notes recorded by Regina Eck, CNM on 09/03/2019 at 4:17 PM EDT  Hi Cynthia Wolf,  Your Chlamydia and gonorrhea screening is negative.  Have a good day

## 2019-09-04 ENCOUNTER — Other Ambulatory Visit: Payer: Self-pay

## 2019-09-04 ENCOUNTER — Ambulatory Visit (INDEPENDENT_AMBULATORY_CARE_PROVIDER_SITE_OTHER)
Admission: EM | Admit: 2019-09-04 | Discharge: 2019-09-04 | Disposition: A | Discharge status: Home / Self Care | Payer: 59 | Source: Home / Self Care

## 2019-09-04 ENCOUNTER — Encounter: Payer: Self-pay | Admitting: Emergency Medicine

## 2019-09-04 ENCOUNTER — Emergency Department (HOSPITAL_COMMUNITY)
Admission: EM | Admit: 2019-09-04 | Discharge: 2019-09-04 | Disposition: A | Discharge status: Home / Self Care | Payer: 59 | Attending: Emergency Medicine | Admitting: Emergency Medicine

## 2019-09-04 ENCOUNTER — Encounter (HOSPITAL_COMMUNITY): Payer: Self-pay | Admitting: Emergency Medicine

## 2019-09-04 ENCOUNTER — Emergency Department (HOSPITAL_COMMUNITY): Payer: 59

## 2019-09-04 DIAGNOSIS — R42 Dizziness and giddiness: Secondary | ICD-10-CM | POA: Insufficient documentation

## 2019-09-04 DIAGNOSIS — Z5321 Procedure and treatment not carried out due to patient leaving prior to being seen by health care provider: Secondary | ICD-10-CM | POA: Insufficient documentation

## 2019-09-04 DIAGNOSIS — I10 Essential (primary) hypertension: Secondary | ICD-10-CM | POA: Diagnosis not present

## 2019-09-04 DIAGNOSIS — R002 Palpitations: Secondary | ICD-10-CM | POA: Diagnosis not present

## 2019-09-04 DIAGNOSIS — R251 Tremor, unspecified: Secondary | ICD-10-CM

## 2019-09-04 LAB — CBC
HCT: 47.1 % — ABNORMAL HIGH (ref 36.0–46.0)
Hemoglobin: 15 g/dL (ref 12.0–15.0)
MCH: 28.8 pg (ref 26.0–34.0)
MCHC: 31.8 g/dL (ref 30.0–36.0)
MCV: 90.4 fL (ref 80.0–100.0)
Platelets: 230 10*3/uL (ref 150–400)
RBC: 5.21 MIL/uL — ABNORMAL HIGH (ref 3.87–5.11)
RDW: 13.6 % (ref 11.5–15.5)
WBC: 11.2 10*3/uL — ABNORMAL HIGH (ref 4.0–10.5)
nRBC: 0 % (ref 0.0–0.2)

## 2019-09-04 LAB — COMPREHENSIVE METABOLIC PANEL
ALT: 21 U/L (ref 0–44)
AST: 23 U/L (ref 15–41)
Albumin: 4 g/dL (ref 3.5–5.0)
Alkaline Phosphatase: 68 U/L (ref 38–126)
Anion gap: 10 (ref 5–15)
BUN: 11 mg/dL (ref 6–20)
CO2: 26 mmol/L (ref 22–32)
Calcium: 8.9 mg/dL (ref 8.9–10.3)
Chloride: 102 mmol/L (ref 98–111)
Creatinine, Ser: 0.56 mg/dL (ref 0.44–1.00)
GFR calc Af Amer: >60 mL/min (ref >60)
GFR calc non Af Amer: >60 mL/min (ref >60)
Glucose, Bld: 106 mg/dL — ABNORMAL HIGH (ref 70–99)
Potassium: 4 mmol/L (ref 3.5–5.1)
Sodium: 138 mmol/L (ref 135–145)
Total Bilirubin: 0.6 mg/dL (ref 0.3–1.2)
Total Protein: 7.5 g/dL (ref 6.5–8.1)

## 2019-09-04 LAB — DIFFERENTIAL
Abs Immature Granulocytes: 0.03 10*3/uL (ref 0.00–0.07)
Basophils Absolute: 0 10*3/uL (ref 0.0–0.1)
Basophils Relative: 0 %
Eosinophils Absolute: 0 10*3/uL (ref 0.0–0.5)
Eosinophils Relative: 0 %
Immature Granulocytes: 0 %
Lymphocytes Relative: 10 %
Lymphs Abs: 1.1 10*3/uL (ref 0.7–4.0)
Monocytes Absolute: 0.5 10*3/uL (ref 0.1–1.0)
Monocytes Relative: 5 %
Neutro Abs: 9.5 10*3/uL — ABNORMAL HIGH (ref 1.7–7.7)
Neutrophils Relative %: 85 %

## 2019-09-04 LAB — I-STAT CHEM 8, ED
BUN: 12 mg/dL (ref 6–20)
Calcium, Ion: 1.15 mmol/L (ref 1.15–1.40)
Chloride: 102 mmol/L (ref 98–111)
Creatinine, Ser: 0.5 mg/dL (ref 0.44–1.00)
Glucose, Bld: 99 mg/dL (ref 70–99)
HCT: 47 % — ABNORMAL HIGH (ref 36.0–46.0)
Hemoglobin: 16 g/dL — ABNORMAL HIGH (ref 12.0–15.0)
Potassium: 4 mmol/L (ref 3.5–5.1)
Sodium: 139 mmol/L (ref 135–145)
TCO2: 26 mmol/L (ref 22–32)

## 2019-09-04 LAB — I-STAT BETA HCG BLOOD, ED (MC, WL, AP ONLY): I-stat hCG, quantitative: <5 m[IU]/mL (ref <5)

## 2019-09-04 LAB — APTT: aPTT: 34 seconds (ref 24–36)

## 2019-09-04 LAB — PROTIME-INR
INR: 1 (ref 0.8–1.2)
Prothrombin Time: 12.7 seconds (ref 11.4–15.2)

## 2019-09-04 LAB — POCT FASTING CBG KUC MANUAL ENTRY: POCT Glucose (KUC): 122 mg/dL — AB (ref 70–99)

## 2019-09-04 MED ORDER — SODIUM CHLORIDE 0.9% FLUSH: 3.0000 mL | Freq: Once | INTRAVENOUS | Status: DC

## 2019-09-04 NOTE — ED Triage Notes (Signed)
Pt presents to [facility] for dizziness, heart palpitations, shaking all starting this morning at work.

## 2019-09-04 NOTE — ED Provider Notes (Signed)
53 year old female with history of hypothyroidism, anxiety, HTN, HLD comes in for acute onset of lightheadedness while walking at work at [employer]. States she then felt "cotton mouth" and noticed intermittent heart palpitations. While sitting down, noticed tremors to her extremity and was brought in by husband. Heart palpations has now resolved. She continues with tremors and lightheadedness with ambulation. Denies CP, SOB, one sided weakness. Denies fever, chills, body aches. Denies confusion. Increased synthroid dosage 1 month ago. Stopped cymbalta on own 1 month ago as she finished medicine and did not have refills.  Hypertensive at 178/97. Otherwise afebrile without tachycardia, tachypnea, hypoxia. CBG 122. RRR. LCTAB. She has tremors to the extremity that does not resolve during exam. She has slight drooping of her left mouth without other obvious neurological deficits.  No obvious changes in gait or coordination.  Given changes to the left mouth at 2.5hrs onset of other symptoms, discussed case with Dr Joseph Art and advice to treat as stroke. EMS transport to ED.     Ok Edwards, PA-C 09/04/19 [phone]

## 2019-09-04 NOTE — ED Triage Notes (Signed)
Pt arrives via [facility] from [facility], pt was at work this morning when she began having dizziness, headache and facial droop, LSN 0700. When EMS arrived at 0944, all neuro deficits had resolved. Pt still has some headache and dizziness upon standing. A/ox4, EMS VSS: 160/80, Hr73, RR18, O2 98%on ra, CBG 112.

## 2019-09-04 NOTE — Telephone Encounter (Signed)
Per patients husband. She is in the emergency. He states he will have her call for results when she can.

## 2019-09-04 NOTE — Telephone Encounter (Signed)
Left message for call back.

## 2019-09-04 NOTE — Telephone Encounter (Signed)
routing to [employer], cnm

## 2019-09-04 NOTE — Discharge Instructions (Addendum)
53 year old female with history of hypothyroidism, anxiety, HTN, HLD comes in for acute onset of lightheadedness while walking at work at [employer]. States she then felt "cotton mouth" and noticed intermittent heart palpitations. While sitting down, noticed tremors to her extremity and was brought in by husband. Heart palpations has now resolved. She continues with tremors and lightheadedness with ambulation. Denies CP, SOB, one sided weakness. Denies fever, chills, body aches. Denies confusion. Increased synthroid dosage 1 month ago. Stopped cymbalta on own 1 month ago as she finished medicine and did not have refills.  Hypertensive at 178/97. Otherwise afebrile without tachycardia, tachypnea, hypoxia. RRR. LCTAB. CBG 122. She has tremors to the extremity that does not resolve during exam. She has slight drooping of her left mouth without other obvious neurological deficits.   Given changes to the left mouth at 2.5hrs onset of other symptoms, discussed case with Dr Joseph Art and advice to treat as stroke. EMS transport to ED.

## 2019-09-07 NOTE — Telephone Encounter (Signed)
Routing to [employer], cnm. Please call patient to give her lab results & decide if its okay for patient to take medication you want to prescribe for infection.

## 2019-09-07 NOTE — Telephone Encounter (Signed)
Patient returned call

## 2019-09-09 ENCOUNTER — Telehealth: Payer: Self-pay | Admitting: Certified Nurse Midwife

## 2019-09-09 NOTE — Telephone Encounter (Signed)
Cynthia Wolf, cnm called patient & requested a call back.

## 2019-09-09 NOTE — Telephone Encounter (Signed)
Patient had gone to the ER per husband. Patient to call deborah leonard back to decide if it is okay for her to take medication for bv.  ------

## 2019-09-09 NOTE — Telephone Encounter (Signed)
Left message regarding discussion of lab and requested call back

## 2019-09-10 NOTE — Telephone Encounter (Signed)
Left message to call back to Triage RN at [facility].

## 2019-09-11 NOTE — Progress Notes (Signed)
53 y.o. G45P1001 Married  Caucasian Fe here for annual exam. Menopausal no HRT. Relates she had some color with wiping  very tiny amount in a 24 hour period in 01/2019, none since then, not sure if from vagina or rectum. No vaginal dryness that she is aware of. Had symptoms of dizziness recently with some hand change and was evaluated at [facility] walk in clinic and sent to ER for ?stroke evaluation. She was told to follow up with PCP and no confirmation of problem. She is having no symptoms now of dizziness or feeling fatigued. Was told her blood sugar was elevated. She is trying to eat better. She has an appointment with PCP with information from ER sent to PCP per patient. No issues today of above. Continues to smoke daily. No headache, visual issues. Has urinary frequency at times, but is related to caffeine use. No urgency or pain with urination. Has not treated BV with Metrogel yet, has not picked RX. Still having some vaginal irritation and external burning as before. No other issues today.  No LMP recorded. (Menstrual status: Other).          Sexually active: Yes.    The current method of family planning is none.    Exercising: Yes.    walking, exercise at work Smoker:  yes  Review of Systems  Constitutional: Negative.   HENT: Negative.   Eyes: Negative.   Respiratory: Negative.   Cardiovascular: Negative.   Gastrointestinal: Negative.   Genitourinary: Negative.   Musculoskeletal: Negative.   Skin: Negative.   Neurological: Negative.   Endo/Heme/Allergies: Negative.   Psychiatric/Behavioral: Negative.     Health Maintenance: Pap:  12-02-15 neg HPV HR neg History of Abnormal Pap: no MMG:  2015 neg per patient Self Breast exams: yes Colonoscopy:  none BMD:   none TDaP:  2014 Shingles: no Pneumonia: no Hep C and HIV: HIV neg 2013, hep c neg 2017 Labs: if needed   reports that she has been smoking cigarettes. She started smoking about 17 years ago. She has smoked for  the past 37.00 years. She has never used smokeless tobacco. She reports current alcohol use of about 7.0 standard drinks of alcohol per week. She reports that she does not use drugs.  Past Medical History:  Diagnosis Date  . Anxiety   . Back injury   . Chicken pox   . Depression   . Endometriosis   . Fibroid    ?  Marland Kitchen Fibromyalgia   . GERD (gastroesophageal reflux disease)   . H/O blood clots    from heavy menstrual cycles. Had shot done to stop bleeding per patient  . Hyperlipidemia   . Hypertension   . Infertility, female   . Migraines   . Osteoarthritis   . PID (pelvic inflammatory disease)   . STD (sexually transmitted disease)    chlamydia  . Thyroid disease     Past Surgical History:  Procedure Laterality Date  . LAPAROSCOPY    . OVARIAN CYST SURGERY      Current Outpatient Medications  Medication Sig Dispense Refill  . DULoxetine (CYMBALTA) 30 MG capsule TAKE 1 CAPSULE BY MOUTH ONCE DAILY FOR 90 DAYS    . levothyroxine (SYNTHROID) 125 MCG tablet Take 125 mcg by mouth daily before breakfast.    . meloxicam (MOBIC) 15 MG tablet Take 1 tablet (15 mg total) by mouth daily. 90 tablet 1  . traMADol (ULTRAM) 50 MG tablet Take by mouth every 6 (six) hours  as needed.     No current facility-administered medications for this visit.     Family History  Problem Relation Age of Onset  . Breast cancer Maternal Aunt   . Cancer Maternal Grandfather   . Arthritis Mother   . Hyperlipidemia Mother   . Stroke Mother   . Hypertension Mother   . Diabetes Mother   . Hyperlipidemia Father   . Other Maternal Uncle        tumor  . Leukemia Paternal Uncle     ROS:  Pertinent items are noted in HPI.  Otherwise, a comprehensive ROS was negative.  Exam:   BP (!) 160/84   Pulse 68   Temp (!) 97.1 F (36.2 C) (Skin)   Resp 16   Ht 5' 1.25" (1.556 m)   Wt 151 lb (68.5 kg)   BMI 28.30 kg/m  Height: 5' 1.25" (155.6 cm) Ht Readings from Last 3 Encounters:  09/14/19 5' 1.25"  (1.556 m)  09/01/19 5' 1.25" (1.556 m)  12/13/16 5' (1.524 m)    General appearance: alert, cooperative and appears stated age Head: Normocephalic, without obvious abnormality, atraumatic Neck: no adenopathy, supple, symmetrical, trachea midline and thyroid normal to inspection and palpation Lungs: clear to auscultation bilaterally Breasts: normal appearance, no masses or tenderness, No nipple retraction or dimpling, No nipple discharge or bleeding, No axillary or supraclavicular adenopathy Heart: regular rate and rhythm Abdomen: soft, non-tender; no masses,  no organomegaly Extremities: extremities normal, atraumatic, no cyanosis or edema Skin: Skin color, texture, turgor normal. No rashes or lesions Lymph nodes: Cervical, supraclavicular, and axillary nodes normal. No abnormal inguinal nodes palpated Neurologic: Grossly normal   Pelvic: External genitalia:  no lesions, but external redness, no scaling              Urethra:  normal appearing urethra with no masses, tenderness or lesions              Bartholin's and Skene's: normal                 Vagina: normal appearing vagina with normal color and watery discharge noted, no lesions              Cervix: no cervical motion tenderness, no lesions and normal appearance              Pap taken: Yes.   Bimanual Exam:  Uterus:  normal size, contour, position, consistency, mobility, non-tender              Adnexa: normal adnexa and no mass, fullness, tenderness               Rectovaginal: Confirms               Anus:  normal sphincter tone, no lesions  Chaperone present: yes  A:  Well Woman with normal exam  Menopausal no HRT   BV not treated, did not call back for message( knew she would be seen today)  Episodic dizziness ? Etiology, patient has PCP appointment for evaluation.  Smoker, no cessation information desired     P:   Reviewed health and wellness pertinent to exam  Aware of need to advise if any bleeding noted from vagina,  this would be PMB and needs to be evaluated  Discussed BV and etiology and she has used Metrogel before with good results.  Rx Metrogel see order with instructions.  Discussed keeping MD appointment for evaluation and if occurs again before appointment to call MD  to be seen ASAP. Patient agreeable.  Encouraged to decrease smoking.  Pap smear: yes   counseled on mammography screening, feminine hygiene, menopause, adequate intake of calcium and vitamin D, diet and exercise  return annually or prn  An After Visit Summary was printed and given to the patient.

## 2019-09-14 ENCOUNTER — Ambulatory Visit
Admission: EM | Admit: 2019-09-14 | Discharge: 2019-09-14 | Disposition: A | Discharge status: Home / Self Care | Payer: 59 | Source: Home / Self Care

## 2019-09-14 ENCOUNTER — Emergency Department (HOSPITAL_COMMUNITY)
Admission: EM | Admit: 2019-09-14 | Discharge: 2019-09-14 | Disposition: A | Discharge status: Home / Self Care | Payer: 59 | Attending: Emergency Medicine | Admitting: Emergency Medicine

## 2019-09-14 ENCOUNTER — Other Ambulatory Visit (HOSPITAL_COMMUNITY)
Admission: RE | Admit: 2019-09-14 | Discharge: 2019-09-14 | Disposition: A | Discharge status: Home / Self Care | Payer: 59 | Source: Ambulatory Visit | Attending: Certified Nurse Midwife | Admitting: Certified Nurse Midwife

## 2019-09-14 ENCOUNTER — Other Ambulatory Visit: Payer: Self-pay

## 2019-09-14 ENCOUNTER — Encounter (HOSPITAL_COMMUNITY): Payer: Self-pay | Admitting: Emergency Medicine

## 2019-09-14 ENCOUNTER — Encounter: Payer: Self-pay | Admitting: Certified Nurse Midwife

## 2019-09-14 ENCOUNTER — Emergency Department (HOSPITAL_COMMUNITY): Payer: 59

## 2019-09-14 ENCOUNTER — Ambulatory Visit (INDEPENDENT_AMBULATORY_CARE_PROVIDER_SITE_OTHER): Payer: 59 | Admitting: Certified Nurse Midwife

## 2019-09-14 VITALS — BP 138/78 | HR 72 | Temp 97.1°F | Resp 16 | Ht 61.25 in | Wt 151.0 lb

## 2019-09-14 DIAGNOSIS — S0033XA Contusion of nose, initial encounter: Secondary | ICD-10-CM

## 2019-09-14 DIAGNOSIS — Z79899 Other long term (current) drug therapy: Secondary | ICD-10-CM | POA: Diagnosis not present

## 2019-09-14 DIAGNOSIS — Y93K9 Activity, other involving animal care: Secondary | ICD-10-CM | POA: Insufficient documentation

## 2019-09-14 DIAGNOSIS — Z124 Encounter for screening for malignant neoplasm of cervix: Secondary | ICD-10-CM | POA: Diagnosis not present

## 2019-09-14 DIAGNOSIS — R040 Epistaxis: Secondary | ICD-10-CM

## 2019-09-14 DIAGNOSIS — Z01419 Encounter for gynecological examination (general) (routine) without abnormal findings: Secondary | ICD-10-CM

## 2019-09-14 DIAGNOSIS — Y92018 Other place in single-family (private) house as the place of occurrence of the external cause: Secondary | ICD-10-CM | POA: Diagnosis not present

## 2019-09-14 DIAGNOSIS — E039 Hypothyroidism, unspecified: Secondary | ICD-10-CM | POA: Insufficient documentation

## 2019-09-14 DIAGNOSIS — F17210 Nicotine dependence, cigarettes, uncomplicated: Secondary | ICD-10-CM | POA: Insufficient documentation

## 2019-09-14 DIAGNOSIS — S0993XA Unspecified injury of face, initial encounter: Secondary | ICD-10-CM | POA: Diagnosis present

## 2019-09-14 DIAGNOSIS — Y998 Other external cause status: Secondary | ICD-10-CM | POA: Diagnosis not present

## 2019-09-14 DIAGNOSIS — N760 Acute vaginitis: Secondary | ICD-10-CM | POA: Diagnosis not present

## 2019-09-14 DIAGNOSIS — B9689 Other specified bacterial agents as the cause of diseases classified elsewhere: Secondary | ICD-10-CM | POA: Diagnosis not present

## 2019-09-14 DIAGNOSIS — W541XXA Struck by dog, initial encounter: Secondary | ICD-10-CM | POA: Diagnosis not present

## 2019-09-14 DIAGNOSIS — I10 Essential (primary) hypertension: Secondary | ICD-10-CM | POA: Insufficient documentation

## 2019-09-14 MED ORDER — HYDROCODONE-ACETAMINOPHEN 5-325 MG PO TABS
1.0000 | ORAL_TABLET | Freq: Once | ORAL | Status: AC
  Administered 2019-09-14: 1 via ORAL
  Filled 2019-09-14: qty 1

## 2019-09-14 MED ORDER — METRONIDAZOLE 0.75 % VA GEL: 1.0000 | Freq: Two times a day (BID) | VAGINAL | 0 refills | Status: DC

## 2019-09-14 MED ORDER — OXYMETAZOLINE HCL 0.05 % NA SOLN
1.0000 | Freq: Once | NASAL | Status: AC
  Administered 2019-09-14: 1 via NASAL
  Filled 2019-09-14: qty 30

## 2019-09-14 NOTE — Discharge Instructions (Signed)
You may apply ice to the nose for 20 minutes at a time for pain and swelling. Take Tylenol or Motrin as needed as directed for pain. Follow up with ENT for recheck. Possible subtle nasal bone fracture not identified on XR today.

## 2019-09-14 NOTE — ED Notes (Signed)
Patient presents to [facility] for assessment after a facial injury involving her nose.  States she has had difficulty getting the bleeding to stop and feels a lot of "crunching" when she tries to apply pressure.  This RN explained our capabilities as an urgent care for assessment and assisting with the bleeding.  Patient decided she wanted to go to the ER for further evaluation/possible CT Scan.

## 2019-09-14 NOTE — ED Provider Notes (Signed)
[facility] DEPT Provider Note   CSN: [id number] Arrival date & time: 09/14/19  1714     History   Chief Complaint Chief Complaint  Patient presents with  . Epistaxis    HPI Cynthia Wolf is a 53 y.o. female.     53 year old female presents with complaint of nosebleed.  Patient states that she was cleaning up after her dog when the dog accidentally ran into her hitting her in her nose.  Patient felt her nose cracked and then had a gushing of blood.  Patient is not anticoagulated, history of whitecoat hypertension, monitors her blood pressure at home and states her blood pressure is always normal when she has for the doctor's office.  No loss of consciousness, no other injuries complaints or concerns.     Past Medical History:  Diagnosis Date  . Anxiety   . Back injury   . Chicken pox   . Depression   . Endometriosis   . Fibroid    ?  Marland Kitchen Fibromyalgia   . GERD (gastroesophageal reflux disease)   . H/O blood clots    from heavy menstrual cycles. Had shot done to stop bleeding per patient  . Hyperlipidemia   . Hypertension   . Infertility, female   . Migraines   . Osteoarthritis   . PID (pelvic inflammatory disease)   . STD (sexually transmitted disease)    chlamydia  . Thyroid disease     Patient Active Problem List   Diagnosis Date Noted  . Numbness and tingling in left arm 09/17/2016  . Visit for screening mammogram 04/17/2016  . Food allergy 04/17/2016  . Left thyroid nodule 04/17/2016  . Hypothyroidism 01/02/2016  . Fibromyalgia 01/02/2016  . Essential hypertension 01/02/2016  . Anxiety and depression 01/02/2016    Past Surgical History:  Procedure Laterality Date  . LAPAROSCOPY    . OVARIAN CYST SURGERY       OB History    Gravida  1   Para  1   Term  1   Preterm      AB      Living  1     SAB      TAB      Ectopic      Multiple      Live Births               Home Medications    Prior  to Admission medications   Medication Sig Start Date End Date Taking? Authorizing Provider  DULoxetine (CYMBALTA) 30 MG capsule Take 30 mg by mouth daily.  05/01/19  Yes [provider]  levothyroxine (SYNTHROID) 125 MCG tablet Take 125 mcg by mouth daily before breakfast.   Yes [provider]  meloxicam (MOBIC) 15 MG tablet Take 1 tablet (15 mg total) by mouth daily. Patient taking differently: Take 15 mg by mouth daily as needed for pain.  01/07/17  Yes Janith Lima, MD  traMADol (ULTRAM) 50 MG tablet Take 50 mg by mouth every 6 (six) hours as needed for moderate pain.    Yes [provider]  metroNIDAZOLE (METROGEL) 0.75 % vaginal gel Place 1 Applicatorful vaginally 2 (two) times daily. 09/14/19   Regina Eck, CNM    Family History Family History  Problem Relation Age of Onset  . Breast cancer Maternal Aunt   . Cancer Maternal Grandfather   . Arthritis Mother   . Hyperlipidemia Mother   . Stroke Mother   . Hypertension  Mother   . Diabetes Mother   . Hyperlipidemia Father   . Other Maternal Uncle        tumor  . Leukemia Paternal Uncle     Social History Social History   Tobacco Use  . Smoking status: Current Every Day Smoker    Years: 37.00    Types: Cigarettes    Start date: 04/17/2002  . Smokeless tobacco: Never Used  Substance Use Topics  . Alcohol use: Yes    Alcohol/week: 7.0 standard drinks    Types: 7 Standard drinks or equivalent per week    Comment: socially  . Drug use: No     Allergies   Bactrim [sulfamethoxazole-trimethoprim], Shellfish allergy, and Yellow dyes (non-tartrazine)   Review of Systems Review of Systems  Constitutional: Negative for fever.  HENT: Positive for nosebleeds.   Eyes: Negative for visual disturbance.  Gastrointestinal: Negative for nausea and vomiting.  Skin: Negative for rash and wound.  Allergic/Immunologic: Negative for immunocompromised state.  Neurological: Negative for dizziness and  weakness.  Hematological: Does not bruise/bleed easily.  All other systems reviewed and are negative.    Physical Exam Updated Vital Signs BP (!) 205/86 (BP Location: Right Arm)   Pulse 89   Temp 98.6 F (37 C) (Oral)   Resp 19   SpO2 100%   Physical Exam Vitals signs and nursing note reviewed.  Constitutional:      General: She is not in acute distress.    Appearance: She is well-developed. She is not diaphoretic.  HENT:     Head: Normocephalic and atraumatic.     Nose: Nasal tenderness present. No nasal deformity.     Right Nostril: Epistaxis present. No septal hematoma.     Left Nostril: Epistaxis present. No septal hematoma.     Comments: Mild active bleeding.  Patient has difficulty applying pressure secondary to pain in her nose. Pulmonary:     Effort: Pulmonary effort is normal.  Skin:    General: Skin is warm and dry.     Findings: No erythema or rash.  Neurological:     Mental Status: She is alert and oriented to person, place, and time.  Psychiatric:        Behavior: Behavior normal.      ED Treatments / Results  Labs (all labs ordered are listed, but only abnormal results are displayed) Labs Reviewed - No data to display  EKG None  Radiology Dg Nasal Bones  Result Date: 09/14/2019 CLINICAL DATA:  Nose injury, nose bleeds EXAM: NASAL BONES - 3+ VIEW COMPARISON:  None. FINDINGS: There is no evidence of fracture or other bone abnormality. IMPRESSION: Negative. Electronically Signed   By: Kathreen Devoid   On: 09/14/2019 19:07    Procedures .Epistaxis Management  Date/Time: 09/14/2019 6:27 PM Performed by: Tacy Learn, PA-C Authorized by: Tacy Learn, PA-C   Consent:    Consent obtained:  Verbal   Consent given by:  Patient   Risks discussed:  Bleeding, nasal injury and pain   Alternatives discussed:  No treatment Anesthesia (see MAR for exact dosages):    Anesthesia method:  None Procedure details:    Treatment site:  L anterior and R  anterior   Repair method: afrin.   Treatment complexity:  Limited   Treatment episode: initial   Post-procedure details:    Assessment:  Bleeding stopped   Patient tolerance of procedure:  Tolerated well, no immediate complications   (including critical care time)  Medications Ordered in  ED Medications  HYDROcodone-acetaminophen (NORCO/VICODIN) 5-325 MG per tablet 1 tablet (has no administration in time range)  oxymetazoline (AFRIN) 0.05 % nasal spray 1 spray (1 spray Each Nare Given 09/14/19 1806)     Initial Impression / Assessment and Plan / ED Course  I have reviewed the triage vital signs and the nursing notes.  Pertinent labs & imaging results that were available during my care of the patient were reviewed by me and considered in my medical decision making (see chart for details).  Clinical Course as of Sep 13 1933  Mon Sep 13, 6560  8924 53 year old female with injury to her nose today resulting in pain in her nose and nosebleed.  Afrin applied followed by 15 minutes of pressure with resolution in nosebleed.  Plan is for x-ray of nasal bones and patient may be discharged to follow-up with ENT if bleeding is controlled. Patient reports her blood pressure is always high at the doctor's office or hospital, monitors her blood pressure at home and does not have high blood pressure.   [LM]  1932 On recheck, nosebleed continues to be controlled at this time.  X-ray is negative for nasal bone fracture, advised patient there may be a subtle fracture not identified on her x-ray today.  Advised she can apply ice to her nose for 20 to 30 minutes at a time, may take Motrin Tylenol for pain and follow-up with ENT. Patient be discharged with Afrin, if nosebleed resumes she is aware she should use the Afrin and hold pressure for 15 minutes, return to ER if bleeding continues.  Patient and spouse are comfortable with discharge instructions and plan.   [LM]    Clinical Course User Index [LM]  Tacy Learn, PA-C      Final Clinical Impressions(s) / ED Diagnoses   Final diagnoses:  Acute anterior epistaxis  Contusion of nose, initial encounter    ED Discharge Orders    None       Roque Lias 09/14/19 Mardelle Matte, MD 09/15/19 1737

## 2019-09-14 NOTE — ED Triage Notes (Signed)
Patient here from home with complaints of nose bleed. Reports dog hit here in nose. Bleeding controlled. Denies blood thinners.

## 2019-09-14 NOTE — Telephone Encounter (Signed)
Patient notified of results by Melvia Heaps, encounter closed.

## 2019-09-14 NOTE — Patient Instructions (Signed)
EXERCISE AND DIET:  We recommended that you start or continue a regular exercise program for good health. Regular exercise means any activity that makes your heart beat faster and makes you sweat.  We recommend exercising at least 30 minutes per day at least 3 days a week, preferably 4 or 5.  We also recommend a diet low in fat and sugar.  Inactivity, poor dietary choices and obesity can cause diabetes, heart attack, stroke, and kidney damage, among others.    ALCOHOL AND SMOKING:  Women should limit their alcohol intake to no more than 7 drinks/beers/glasses of wine (combined, not each!) per week. Moderation of alcohol intake to this level decreases your risk of breast cancer and liver damage. And of course, no recreational drugs are part of a healthy lifestyle.  And absolutely no smoking or even second hand smoke. Most people know smoking can cause heart and lung diseases, but did you know it also contributes to weakening of your bones? Aging of your skin?  Yellowing of your teeth and nails?  CALCIUM AND VITAMIN D:  Adequate intake of calcium and Vitamin D are recommended.  The recommendations for exact amounts of these supplements seem to change often, but generally speaking 600 mg of calcium (either carbonate or citrate) and 800 units of Vitamin D per day seems prudent. Certain women may benefit from higher intake of Vitamin D.  If you are among these women, your doctor will have told you during your visit.    PAP SMEARS:  Pap smears, to check for cervical cancer or precancers,  have traditionally been done yearly, although recent scientific advances have shown that most women can have pap smears less often.  However, every woman still should have a physical exam from her gynecologist every year. It will include a breast check, inspection of the vulva and vagina to check for abnormal growths or skin changes, a visual exam of the cervix, and then an exam to evaluate the size and shape of the uterus and  ovaries.  And after 51 years of age, a rectal exam is indicated to check for rectal cancers. We will also provide age appropriate advice regarding health maintenance, like when you should have certain vaccines, screening for sexually transmitted diseases, bone density testing, colonoscopy, mammograms, etc.   MAMMOGRAMS:  All women over 40 years old should have a yearly mammogram. Many facilities now offer a "3D" mammogram, which may cost around $50 extra out of pocket. If possible,  we recommend you accept the option to have the 3D mammogram performed.  It both reduces the number of women who will be called back for extra views which then turn out to be normal, and it is better than the routine mammogram at detecting truly abnormal areas.    COLONOSCOPY:  Colonoscopy to screen for colon cancer is recommended for all women at age 50.  We know, you hate the idea of the prep.  We agree, BUT, having colon cancer and not knowing it is worse!!  Colon cancer so often starts as a polyp that can be seen and removed at colonscopy, which can quite literally save your life!  And if your first colonoscopy is normal and you have no family history of colon cancer, most women don't have to have it again for 10 years.  Once every ten years, you can do something that may end up saving your life, right?  We will be happy to help you get it scheduled when you are ready.    Be sure to check your insurance coverage so you understand how much it will cost.  It may be covered as a preventative service at no cost, but you should check your particular policy.      Bacterial Vaginosis  Bacterial vaginosis is an infection of the vagina. It happens when too many normal germs (healthy bacteria) grow in the vagina. This infection puts you at risk for infections from sex (STIs). Treating this infection can lower your risk for some STIs. You should also treat this if you are pregnant. It can cause your baby to be born early. Follow these  instructions at home: Medicines  Take over-the-counter and prescription medicines only as told by your doctor.  Take or use your antibiotic medicine as told by your doctor. Do not stop taking or using it even if you start to feel better. General instructions  If you your sexual partner is a woman, tell her that you have this infection. She needs to get treatment if she has symptoms. If you have a female partner, he does not need to be treated.  During treatment: ? Avoid sex. ? Do not douche. ? Avoid alcohol as told. ? Avoid breastfeeding as told.  Drink enough fluid to keep your pee (urine) clear or pale yellow.  Keep your vagina and butt (rectum) clean. ? Wash the area with warm water every day. ? Wipe from front to back after you use the toilet.  Keep all follow-up visits as told by your doctor. This is important. Preventing this condition  Do not douche.  Use only warm water to wash around your vagina.  Use protection when you have sex. This includes: ? Latex condoms. ? Dental dams.  Limit how many people you have sex with. It is best to only have sex with the same person (be monogamous).  Get tested for STIs. Have your partner get tested.  Wear underwear that is cotton or lined with cotton.  Avoid tight pants and pantyhose. This is most important in summer.  Do not use any products that have nicotine or tobacco in them. These include cigarettes and e-cigarettes. If you need help quitting, ask your doctor.  Do not use illegal drugs.  Limit how much alcohol you drink. Contact a doctor if:  Your symptoms do not get better, even after you are treated.  You have more discharge or pain when you pee (urinate).  You have a fever.  You have pain in your belly (abdomen).  You have pain with sex.  Your bleed from your vagina between periods. Summary  This infection happens when too many germs (bacteria) grow in the vagina.  Treating this condition can lower your  risk for some infections from sex (STIs).  You should also treat this if you are pregnant. It can cause early (premature) birth.  Do not stop taking or using your antibiotic medicine even if you start to feel better. This information is not intended to replace advice given to you by your health care provider. Make sure you discuss any questions you have with your health care provider. Document Released: 08/07/2008 Document Revised: 10/11/2017 Document Reviewed: 07/14/2016 Elsevier Patient Education  2020 [employer].

## 2019-09-16 LAB — CYTOLOGY - PAP: Diagnosis: NEGATIVE

## 2019-10-20 ENCOUNTER — Ambulatory Visit
Admission: EM | Admit: 2019-10-20 | Discharge: 2019-10-20 | Disposition: A | Discharge status: Home / Self Care | Payer: 59 | Attending: Physician Assistant | Admitting: Physician Assistant

## 2019-10-20 DIAGNOSIS — M62838 Other muscle spasm: Secondary | ICD-10-CM

## 2019-10-20 DIAGNOSIS — M546 Pain in thoracic spine: Secondary | ICD-10-CM | POA: Diagnosis not present

## 2019-10-20 DIAGNOSIS — I10 Essential (primary) hypertension: Secondary | ICD-10-CM

## 2019-10-20 MED ORDER — TIZANIDINE HCL 4 MG PO TABS: 4.0000 mg | ORAL_TABLET | Freq: Three times a day (TID) | ORAL | 0 refills | Status: DC | PRN

## 2019-10-20 MED ORDER — PREDNISONE 50 MG PO TABS: 50.0000 mg | ORAL_TABLET | Freq: Every day | ORAL | 0 refills | Status: DC

## 2019-10-20 NOTE — ED Provider Notes (Signed)
[facility]    CSN: [id number] Arrival date & time: 10/20/19  1343      History   Chief Complaint Chief Complaint  Patient presents with  . Back Pain    HPI Cynthia Wolf is a 53 y.o. female.   53 year old female comes in for right upper back pain that goes to the right shoulder. Denies injury/trauma. Has had spasms with certain movement, which can radiate pain to the right lateral chest/flank. Denies cough, shortness of breath. Denies fever, chills, body aches. Denies nausea/vomiting. Has history of chronic spin/back pain. Tried advil without relief.      Past Medical History:  Diagnosis Date  . Anxiety   . Back injury   . Chicken pox   . Depression   . Endometriosis   . Fibroid    ?  Marland Kitchen Fibromyalgia   . GERD (gastroesophageal reflux disease)   . H/O blood clots    from heavy menstrual cycles. Had shot done to stop bleeding per patient  . Hyperlipidemia   . Hypertension   . Infertility, female   . Migraines   . Osteoarthritis   . PID (pelvic inflammatory disease)   . STD (sexually transmitted disease)    chlamydia  . Thyroid disease     Patient Active Problem List   Diagnosis Date Noted  . Numbness and tingling in left arm 09/17/2016  . Visit for screening mammogram 04/17/2016  . Food allergy 04/17/2016  . Left thyroid nodule 04/17/2016  . Hypothyroidism 01/02/2016  . Fibromyalgia 01/02/2016  . Essential hypertension 01/02/2016  . Anxiety and depression 01/02/2016    Past Surgical History:  Procedure Laterality Date  . LAPAROSCOPY    . OVARIAN CYST SURGERY      OB History    Gravida  1   Para  1   Term  1   Preterm      AB      Living  1     SAB      TAB      Ectopic      Multiple      Live Births               Home Medications    Prior to Admission medications   Medication Sig Start Date End Date Taking? Authorizing Provider  DULoxetine (CYMBALTA) 30 MG capsule Take 30 mg by mouth daily.  05/01/19    [provider]  levothyroxine (SYNTHROID) 125 MCG tablet Take 125 mcg by mouth daily before breakfast.    [provider]  metroNIDAZOLE (METROGEL) 0.75 % vaginal gel Place 1 Applicatorful vaginally 2 (two) times daily. 09/14/19   Regina Eck, CNM  predniSONE (DELTASONE) 50 MG tablet Take 1 tablet (50 mg total) by mouth daily with breakfast. 10/20/19   Tasia Catchings, Amy V, PA-C  tiZANidine (ZANAFLEX) 4 MG tablet Take 1 tablet (4 mg total) by mouth every 8 (eight) hours as needed for muscle spasms. 10/20/19   Tasia Catchings, Amy V, PA-C  traMADol (ULTRAM) 50 MG tablet Take 50 mg by mouth every 6 (six) hours as needed for moderate pain.     [provider]    Family History Family History  Problem Relation Age of Onset  . Breast cancer Maternal Aunt   . Cancer Maternal Grandfather   . Arthritis Mother   . Hyperlipidemia Mother   . Stroke Mother   . Hypertension Mother   . Diabetes Mother   . Hyperlipidemia Father   .  Other Maternal Uncle        tumor  . Leukemia Paternal Uncle     Social History Social History   Tobacco Use  . Smoking status: Current Every Day Smoker    Years: 37.00    Types: Cigarettes    Start date: 04/17/2002  . Smokeless tobacco: Never Used  Substance Use Topics  . Alcohol use: Yes    Alcohol/week: 7.0 standard drinks    Types: 7 Standard drinks or equivalent per week    Comment: socially  . Drug use: No     Allergies   Bactrim [sulfamethoxazole-trimethoprim], Shellfish allergy, and Yellow dyes (non-tartrazine)   Review of Systems Review of Systems  Reason unable to perform ROS: See HPI as above.     Physical Exam Triage Vital Signs ED Triage Vitals  Enc Vitals Group     BP 10/20/19 1405 (!) 155/88     Pulse Rate 10/20/19 1405 95     Resp 10/20/19 1405 18     Temp 10/20/19 1405 99.5 F (37.5 C)     Temp Source 10/20/19 1405 Oral     SpO2 10/20/19 1405 95 %     Weight --      Height --      Head Circumference --      Peak  Flow --      Pain Score 10/20/19 1415 8     Pain Loc --      Pain Edu? --      Excl. in [place]? --    No data found.  Updated Vital Signs BP (!) 155/88 (BP Location: Right Arm)   Pulse 95   Temp 99.5 F (37.5 C) (Oral)   Resp 18   LMP 11/24/2016   SpO2 95%   Physical Exam Constitutional:      General: She is not in acute distress.    Appearance: She is well-developed. She is not diaphoretic.  HENT:     Head: Normocephalic and atraumatic.  Eyes:     Conjunctiva/sclera: Conjunctivae normal.     Pupils: Pupils are equal, round, and reactive to light.  Cardiovascular:     Rate and Rhythm: Normal rate and regular rhythm.     Heart sounds: Normal heart sounds. No murmur. No friction rub. No gallop.   Pulmonary:     Effort: Pulmonary effort is normal. No accessory muscle usage or respiratory distress.     Breath sounds: Normal breath sounds. No stridor. No decreased breath sounds, wheezing, rhonchi or rales.  Musculoskeletal:     Comments: No obvious swelling, erythema, warmth, rash, contusion.  No tenderness on palpation of the spinous processes.  Diffuse tenderness to palpation of right thoracic back, along right lower ribs, to anterior chest.  Decreased range of motion of shoulder and back due to pain.  Sensation intact and equal bilaterally.  Radial pulse 2+, cap refill less than 2 seconds.  Skin:    General: Skin is warm and dry.  Neurological:     Mental Status: She is alert and oriented to person, place, and time.     UC Treatments / Results  Labs (all labs ordered are listed, but only abnormal results are displayed) Labs Reviewed - No data to display  EKG   Radiology No results found.  Procedures Procedures (including critical care time)  Medications Ordered in UC Medications - No data to display  Initial Impression / Assessment and Plan / UC Course  I have reviewed the triage vital signs  and the nursing notes.  Pertinent labs & imaging results that were  available during my care of the patient were reviewed by me and considered in my medical decision making (see chart for details).    Offered toradol injection in office today, patient declined at this time. Prednisone as directed. Muscle relaxant as needed. Ice/heat compresses. Discussed with patient this can take up to 3-4 weeks to resolve, but should be getting better each week. Return precautions given.   Final Clinical Impressions(s) / UC Diagnoses   Final diagnoses:  Acute right-sided thoracic back pain  Muscle spasm   ED Prescriptions    Medication Sig Dispense Auth. Provider   predniSONE (DELTASONE) 50 MG tablet Take 1 tablet (50 mg total) by mouth daily with breakfast. 5 tablet Yu, Amy V, PA-C   tiZANidine (ZANAFLEX) 4 MG tablet Take 1 tablet (4 mg total) by mouth every 8 (eight) hours as needed for muscle spasms. 15 tablet Ok Edwards, PA-C     I have reviewed the PDMP during this encounter.   Ok Edwards, PA-C 10/20/19 1531

## 2019-10-20 NOTE — ED Triage Notes (Signed)
Pt c/o rt upper back muscle spasms radiating up to rt shoulder since last night. Denies injury. Hx of chronic spine and back pain

## 2019-10-20 NOTE — ED Notes (Signed)
Patient able to ambulate independently  

## 2019-10-20 NOTE — Discharge Instructions (Signed)
Prednisone as directed. Tizanidine as needed, this can make you drowsy, so do not take if you are going to drive, operate heavy machinery, or make important decisions. Ice/heat compresses as needed. This can take up to 3-4 weeks to completely resolve, but you should be feeling better each week. Follow up with PCP for further evaluation if symptoms not improving.  If developing chest pain, shortness of breath, trouble breathing, go to the emergency department for further evaluation.

## 2019-11-17 ENCOUNTER — Telehealth: Payer: Self-pay | Admitting: Certified Nurse Midwife

## 2019-11-17 NOTE — Telephone Encounter (Signed)
Patient is calling regarding bacterial vaginosis. Patient stated that when she was seen in November, she was given Metrogel. Patient stated that she is still experiencing "pressuring when urinating." Patient stated that the feeling of having to urinate will come and go. Patient stated these are the same symptoms that she was experiencing when she was diagnosed.

## 2019-11-17 NOTE — Telephone Encounter (Signed)
Spoke with patient. Patient states she was tx for BV on 09/01/19 and 09/14/19. Did not start Metrogel until 10/09/19. Patient reports she has completed medication and symptoms have not changed or resolved. Reports pelvic pressure, urinary urgency and frequency, voids normal amounts. Feels tingling sensation when voiding. Denies vag d/c, odor, bleeding, itching, flank pain, fever/chills, N/V. Patient requesting additional Rx for BV.   Advised patient OV needed for further evaluation, patient agreeable. OV scheduled for 1/11 at 9am with Melvia Heaps, CNM. Patient declines earlier OV due to work schedule. Advised patient if new symptoms develop or symptoms worsen, return call to office for earlier OV. Patient agreeable.   Routing to provider for final review. Patient is agreeable to disposition. Will close encounter.

## 2019-11-23 ENCOUNTER — Ambulatory Visit: Payer: 59 | Admitting: Certified Nurse Midwife

## 2019-11-23 ENCOUNTER — Encounter: Payer: Self-pay | Admitting: Certified Nurse Midwife

## 2019-11-23 ENCOUNTER — Other Ambulatory Visit: Payer: Self-pay

## 2019-11-23 VITALS — BP 120/80 | HR 68 | Temp 97.1°F | Resp 16 | Wt 154.0 lb

## 2019-11-23 DIAGNOSIS — Z01419 Encounter for gynecological examination (general) (routine) without abnormal findings: Secondary | ICD-10-CM

## 2019-11-23 DIAGNOSIS — N898 Other specified noninflammatory disorders of vagina: Secondary | ICD-10-CM

## 2019-11-23 DIAGNOSIS — N390 Urinary tract infection, site not specified: Secondary | ICD-10-CM | POA: Diagnosis not present

## 2019-11-23 DIAGNOSIS — R3915 Urgency of urination: Secondary | ICD-10-CM | POA: Diagnosis not present

## 2019-11-23 LAB — POCT URINALYSIS DIPSTICK
Bilirubin, UA: NEGATIVE
Blood, UA: NEGATIVE
Glucose, UA: NEGATIVE
Ketones, UA: NEGATIVE
Leukocytes, UA: NEGATIVE
Nitrite, UA: NEGATIVE
Protein, UA: NEGATIVE
Urobilinogen, UA: NEGATIVE E.U./dL — AB
pH, UA: 5 (ref 5.0–8.0)

## 2019-11-23 NOTE — Progress Notes (Signed)
54 y.o. Married Caucasian female [id number] here with complaint of urinary pressure to urinate every hour. No urinary odor or blood urine. She feels this has been going on since last visit in 09/2019. Drinks cafeinated coffee and soda, fruit soda only. Drinks one glass of water daily. Also does caffeine shots 2-3 times daily to help with fatigue. Patient complaining of urinary frequency. Has a "tingling feeling occasionally prior to urination, no pain with urination. Patient denies fever, chills, nausea or back pain. Has changed soap to bath foam wash.. Patient feels could be related to sexual activity, used new KY with warming.Denies any vaginal symptoms other than discharge. Marland Kitchen Hot flashes occasional. Menopausal with some vaginal dryness. Patient is not drinking adequate water intake. No thong underwear use or dryer sheet use with underwear. No other health issues today  Review of Systems  Genitourinary:       Pressure & tingle feeling with urination    O: Healthy female WDWN Affect: Normal, orientation x 3 Skin : warm and dry CVAT: negative bilateral Abdomen: soft, negative for suprapubic tenderness  Pelvic exam: External genital area: normal, no lesions, slight redness noted around vaginal opening Bladder,Urethra non tender, Urethral meatus:non tender, no redness Vagina: white normal appearing vaginal discharge, normal appearance  Affirm taken Cervix: normal, non tender, no CMT Uterus:normal,non tender Adnexa: normal non tender, no fullness or masses  poct urine-neg  A: Normal pelvic exam Urinary urgency/frequency R/O UTI Suspect caffeine stimulation and possible overload Menopausal vaginal dryness Suspect irritation with warming lubricant  P: Reviewed findings of normal pelvic exam.  Discussed caffeine effect on urination and bladder. Need to increase water to hydrate and flush bladder. Discussed gradual wean down of caffeine use and caffeine shots. Increase water intake as she weans  down. Discussed vaginal dryness and the warming lubricant could have irritated the vaginal walls and urethral meatus. Discussed coconut oil use instead for lubrication.  Start on cranberry capsules one daily with water for bladder health. Questions addressed. Reviewed warning signs of UTI Lab: Urine culture, affirm  Rv prn

## 2019-11-23 NOTE — Patient Instructions (Signed)
Caffeine Withdrawal  Caffeine withdrawal is a group of symptoms that can develop when a person who consumes a lot of caffeine every day suddenly stops or greatly reduces his or her caffeine intake. Caffeine is a drug that is usually found in coffee, tea, soda, cocoa, chocolate milk, chocolate, and some over-the-counter pain relievers and medicines. If you consume too much caffeine for a long period of time, you may go through caffeine withdrawal when you stop or reduce your intake. What are the causes? This condition is caused by having less caffeine than your body is used to having. What increases the risk? The following factors may make you more likely to develop this condition:  Having a history of other substance use disorders.  Having a history of a mood disorder, anxiety disorder, psychiatric disorder, or eating disorder.  Being in a situation that restricts caffeine use, such as pregnancy, fasting, medical procedures, or hospitalization.  Using energy drinks. What are the signs or symptoms? Symptoms of this condition include:  Feeling more tired than usual.  Headaches.  Having trouble concentrating or staying alert.  Feeling irritable.  Feeling like you have the flu.  Craving caffeine.  Depression.  Nausea or vomiting.  Joint stiffness or aching muscles. Your symptoms may be more or less severe, depending on how much caffeine you consume or have been consuming over time. How is this diagnosed? This condition is usually diagnosed based on your symptoms and your recent history of caffeine use. Your health care provider may ask you about any history of stimulant abuse or use of other substances. In some cases, you may be asked to use a food journal to keep track of how much caffeine you have every day. How is this treated? Treatment for this condition will focus on addressing the symptoms. Your health care provider may recommend that you:  Consume more caffeine at first  to help end the withdrawal symptoms.  Slowly reduce your caffeine use over time to avoid symptoms of caffeine withdrawal while removing it from your diet. Your health care provider can help you decide if you want to limit (cut back on) or eliminate caffeine from your diet. Other treatments may be recommended to help with any underlying reasons for your high caffeine use. Your health care provider may also recommend techniques to manage stress. Follow these instructions at home:  Do not stop having caffeine all at once. Doing that may cause severe withdrawal symptoms.  To avoid withdrawal symptoms, do not have more than 50 mg of caffeine--equal to  cup of coffee--in one day.  Cut back on caffeine slowly over time as directed by your health care provider. For example, try mixing a caffeinated soda with a decaf (decaffeinated) soda.  Try replacing coffee, tea, or soda with a decaf drink.  Find ways to manage stress, such as by: ? Meditating. ? Being more active. ? Using deep breathing exercises.  Contact a health care provider if:  Your headaches or other withdrawal symptoms do not go away after several days of reduced usage, or they do not go away after you start using caffeine again. Get help right away if:  You feel depressed or have suicidal thoughts.  You are vomiting or have severe dehydration. If you ever feel like you may hurt yourself or others, or have thoughts about taking your own life, get help right away. You can go to your nearest emergency department or call:  Your local emergency services (911 in the [place]  A  suicide crisis helpline, such as the [place] at 980-062-9325. This is open 24 hours a day. Summary  Caffeine withdrawal is a group of symptoms that can develop when a person who consumes a lot of caffeine every day suddenly stops or greatly reduces his or her caffeine intake.  Your withdrawal symptoms may be more or less severe,  depending on how much caffeine you consume or have been consuming over time.  Your health care provider can help you decide if you want to limit (cut back on) or eliminate caffeine from your diet.  To avoid withdrawal symptoms, do not have more than 50 mg of caffeine--equal to  cup of coffee--in one day. This information is not intended to replace advice given to you by your health care provider. Make sure you discuss any questions you have with your health care provider. Document Revised: 10/11/2017 Document Reviewed: 02/21/2017 Elsevier Patient Education  2020 [employer]. Atrophic Vaginitis Atrophic vaginitis is a condition in which the tissues that line the vagina become dry and thin. This condition occurs in women who have stopped having their period. It is caused by a drop in a female hormone (estrogen). This hormone helps:  To keep the vagina moist.  To make a clear fluid. This clear fluid helps: ? To make the vagina ready for sex. ? To protect the vagina from infection. If the lining of the vagina is dry and thin, it may cause irritation, burning, or itchiness. It may also:  Make sex painful.  Make an exam of your vagina painful.  Cause bleeding.  Make you lose interest in sex.  Cause a burning feeling when you pee (urinate).  Cause a brown or yellow fluid to come from your vagina. Some women do not have symptoms. Follow these instructions at home: Medicines  Take over-the-counter and prescription medicines only as told by your doctor.  Do not use herbs or other medicines unless your doctor says it is okay.  Use medicines for for dryness. These include: ? Oils to make the vagina soft. ? Creams. ? Moisturizers. General instructions  Do not douche.  Do not use products that can make your vagina dry. These include: ? Scented sprays. ? Scented tampons. ? Scented soaps.  Sex can help increase blood flow and soften the tissue in the vagina. If it hurts to have  sex: ? Tell your partner. ? Use products to make sex more comfortable. Use these only as told by your doctor. Contact a doctor if you:  Have discharge from the vagina that is different than usual.  Have a bad smell coming from your vagina.  Have new symptoms.  Do not get better.  Get worse. Summary  Atrophic vaginitis is a condition in which the lining of the vagina becomes dry and thin.  This condition affects women who have stopped having their periods.  Treatment may include using products that help make the vagina soft.  Call a doctor if do not get better with treatment. This information is not intended to replace advice given to you by your health care provider. Make sure you discuss any questions you have with your health care provider. Document Revised: 11/11/2017 Document Reviewed: 11/11/2017 Elsevier Patient Education  2020 [employer].

## 2019-11-25 LAB — VAGINITIS/VAGINOSIS, DNA PROBE
Candida Species: NEGATIVE
Gardnerella vaginalis: POSITIVE — AB
Trichomonas vaginosis: NEGATIVE

## 2019-11-26 ENCOUNTER — Telehealth: Payer: Self-pay | Admitting: *Deleted

## 2019-11-26 LAB — URINE CULTURE

## 2019-11-26 NOTE — Telephone Encounter (Signed)
Burnice Logan, RN  11/26/2019 2:38 PM EST    Left message to call Sharee Pimple, RN at [facility].

## 2019-11-26 NOTE — Telephone Encounter (Signed)
-----   Message from Regina Eck, CNM sent at 11/26/2019  9:37 AM EST ----- Notify patient her vaginal screening was positive for BV only. She will need Rx Flagyl 500 mg bid x 7 avoid ETOH during treatment and take with food Urine culture pending

## 2019-11-27 MED ORDER — METRONIDAZOLE 0.75 % VA GEL: 1.0000 | Freq: Every day | VAGINAL | 0 refills | Status: AC

## 2019-11-27 MED ORDER — NITROFURANTOIN MONOHYD MACRO 100 MG PO CAPS: 100.0000 mg | ORAL_CAPSULE | Freq: Two times a day (BID) | ORAL | 0 refills | Status: DC

## 2019-11-27 NOTE — Telephone Encounter (Signed)
Patient notified & rx's sent to her pharmacy. Allergy warning came up for macrobid due to yellow dye. Patient states she is okay to take it. She said its only the yellow dye from mustard that only caused a canker sore. No other allergy symptoms from it.

## 2019-11-27 NOTE — Telephone Encounter (Signed)
Left message to call Montrell Cessna, RN at [facility] [phone].   

## 2019-11-27 NOTE — Telephone Encounter (Signed)
-----   Message from Regina Eck, CNM sent at 11/26/2019  6:38 PM EST ----- Notify patient urine culture showed Staph. She will need Rx Macrobid 100 mg bid x 7.  Instead of oral Flagyl so she can clear both at same time she should have Rx Metrogel one applicator every hs x 7.

## 2019-11-27 NOTE — Telephone Encounter (Signed)
I tried to reach patient. No answer. ?

## 2019-11-28 ENCOUNTER — Other Ambulatory Visit: Payer: Self-pay | Admitting: Certified Nurse Midwife

## 2019-12-04 ENCOUNTER — Telehealth: Payer: Self-pay | Admitting: Internal Medicine

## 2019-12-04 NOTE — Telephone Encounter (Signed)
Called patient to remind of appointment and to let her know it will be over the phone, patients phone said it could not be completed.

## 2019-12-07 ENCOUNTER — Ambulatory Visit: Payer: 59

## 2020-01-11 ENCOUNTER — Telehealth: Payer: Self-pay | Admitting: Certified Nurse Midwife

## 2020-01-11 NOTE — Telephone Encounter (Signed)
Left message to call Seaborn Nakama, RN at [facility] [phone].   

## 2020-01-11 NOTE — Telephone Encounter (Signed)
Appointment Request From: Danella Penton    With Provider: Melvia Heaps, CNM [facility]]    Preferred Date Range: Any date 01/11/2020 or later    Preferred Times: Monday Morning    Reason for visit: Office Visit    Comments:  Hurts really bad to pee,I have stinging while peeing, looks like a small discharge from vaginal area

## 2020-01-12 NOTE — Telephone Encounter (Signed)
Left message to call Caidynce Muzyka, RN at [facility] [phone].   

## 2020-01-18 ENCOUNTER — Telehealth: Payer: Self-pay | Admitting: *Deleted

## 2020-01-18 NOTE — Telephone Encounter (Signed)
Erroneous encounter.   Encounter closed.

## 2020-01-18 NOTE — Telephone Encounter (Signed)
Cynthia Wolf, CNM  Burnice Logan, RN  Feel adequate attempts made to help patient.

## 2020-01-18 NOTE — Telephone Encounter (Signed)
No return call from patient x2.   Routing to [employer], CNM FYI.   Encounter closed.

## 2020-02-01 ENCOUNTER — Encounter: Payer: Self-pay | Admitting: Certified Nurse Midwife

## 2020-05-27 ENCOUNTER — Ambulatory Visit
Admission: EM | Admit: 2020-05-27 | Discharge: 2020-05-27 | Disposition: A | Discharge status: Home / Self Care | Payer: 59

## 2020-05-27 ENCOUNTER — Ambulatory Visit (INDEPENDENT_AMBULATORY_CARE_PROVIDER_SITE_OTHER): Payer: 59

## 2020-05-27 ENCOUNTER — Other Ambulatory Visit: Payer: Self-pay

## 2020-05-27 DIAGNOSIS — R05 Cough: Secondary | ICD-10-CM

## 2020-05-27 DIAGNOSIS — J42 Unspecified chronic bronchitis: Secondary | ICD-10-CM | POA: Diagnosis not present

## 2020-05-27 MED ORDER — BENZONATATE 100 MG PO CAPS: 100.0000 mg | ORAL_CAPSULE | Freq: Three times a day (TID) | ORAL | 0 refills | Status: AC

## 2020-05-27 MED ORDER — PREDNISONE 50 MG PO TABS: 50.0000 mg | ORAL_TABLET | Freq: Every day | ORAL | 0 refills | Status: AC

## 2020-05-27 NOTE — ED Provider Notes (Signed)
[facility]    CSN: [id number] Arrival date & time: 05/27/20  1254      History   Chief Complaint Chief Complaint  Patient presents with  . Cough    HPI Cynthia Wolf is a 54 y.o. female with history of hypertension, obesity presenting for persistent cough.  Patient provides history: States she has been coughing for almost 3 weeks now.  Prescribed amoxicillin via video visit on 7/5, though did not pick up until approximately 3 days ago.  States that she did not start amoxicillin until recently due to "feeling better, but then I felt worse ".  Endorsing mild sputum production: White/clear.  No blood, chest pain, palpitations, shortness of breath.  Underwent Covid testing 1 week into symptoms: Negative.   Past Medical History:  Diagnosis Date  . Anxiety   . Back injury   . Chicken pox   . Depression   . Endometriosis   . Fibroid    ?  Marland Kitchen Fibromyalgia   . GERD (gastroesophageal reflux disease)   . H/O blood clots    from heavy menstrual cycles. Had shot done to stop bleeding per patient  . Hyperlipidemia   . Hypertension   . Infertility, female   . Migraines   . Osteoarthritis   . PID (pelvic inflammatory disease)   . STD (sexually transmitted disease)    chlamydia  . Thyroid disease     Patient Active Problem List   Diagnosis Date Noted  . Numbness and tingling in left arm 09/17/2016  . Visit for screening mammogram 04/17/2016  . Food allergy 04/17/2016  . Left thyroid nodule 04/17/2016  . Hypothyroidism 01/02/2016  . Fibromyalgia 01/02/2016  . Essential hypertension 01/02/2016  . Anxiety and depression 01/02/2016    Past Surgical History:  Procedure Laterality Date  . LAPAROSCOPY    . OVARIAN CYST SURGERY      OB History    Gravida  1   Para  1   Term  1   Preterm      AB      Living  1     SAB      TAB      Ectopic      Multiple      Live Births               Home Medications    Prior to Admission medications    Medication Sig Start Date End Date Taking? Authorizing Provider  DULoxetine (CYMBALTA) 30 MG capsule Take 30 mg by mouth daily.  05/01/19   [provider]  levothyroxine (SYNTHROID) 125 MCG tablet Take 125 mcg by mouth daily before breakfast.    [provider]  meloxicam (MOBIC) 15 MG tablet Take 15 mg by mouth daily. 03/25/20   [provider]  predniSONE (DELTASONE) 50 MG tablet Take 1 tablet (50 mg total) by mouth daily with breakfast. 05/27/20   Hall-Potvin, [place], PA-C    Family History Family History  Problem Relation Age of Onset  . Breast cancer Maternal Aunt   . Cancer Maternal Grandfather   . Arthritis Mother   . Hyperlipidemia Mother   . Stroke Mother   . Hypertension Mother   . Diabetes Mother   . Hyperlipidemia Father   . Other Maternal Uncle        tumor  . Leukemia Paternal Uncle     Social History Social History   Tobacco Use  . Smoking status: Current Every Day Smoker  Years: 37.00    Types: Cigarettes    Start date: 04/17/2002  . Smokeless tobacco: Never Used  Substance Use Topics  . Alcohol use: Yes    Alcohol/week: 7.0 standard drinks    Types: 7 Standard drinks or equivalent per week    Comment: socially  . Drug use: No     Allergies   Bactrim [sulfamethoxazole-trimethoprim], Shellfish allergy, and Yellow dyes (non-tartrazine)   Review of Systems As per HPI   Physical Exam Triage Vital Signs ED Triage Vitals  Enc Vitals Group     BP 05/27/20 1303 (!) 156/86     Pulse Rate 05/27/20 1303 93     Resp 05/27/20 1303 18     Temp 05/27/20 1303 98.4 F (36.9 C)     Temp src --      SpO2 05/27/20 1303 96 %     Weight --      Height --      Head Circumference --      Peak Flow --      Pain Score 05/27/20 1305 0     Pain Loc --      Pain Edu? --      Excl. in [place]? --    No data found.  Updated Vital Signs BP (!) 156/86   Pulse 93   Temp 98.4 F (36.9 C)   Resp 18   LMP 11/24/2016   SpO2 96%    Visual Acuity Right Eye Distance:   Left Eye Distance:   Bilateral Distance:    Right Eye Near:   Left Eye Near:    Bilateral Near:     Physical Exam Constitutional:      General: She is not in acute distress.    Appearance: She is obese. She is not ill-appearing or diaphoretic.  HENT:     Head: Normocephalic and atraumatic.     Mouth/Throat:     Mouth: Mucous membranes are moist.     Pharynx: Oropharynx is clear. No oropharyngeal exudate or posterior oropharyngeal erythema.  Eyes:     General: No scleral icterus.    Conjunctiva/sclera: Conjunctivae normal.     Pupils: Pupils are equal, round, and reactive to light.  Neck:     Comments: Trachea midline, negative JVD Cardiovascular:     Rate and Rhythm: Normal rate and regular rhythm.     Heart sounds: No murmur heard.  No gallop.   Pulmonary:     Effort: Pulmonary effort is normal. No respiratory distress.     Breath sounds: No wheezing, rhonchi or rales.  Musculoskeletal:     Cervical back: Neck supple. No tenderness.  Lymphadenopathy:     Cervical: No cervical adenopathy.  Skin:    Capillary Refill: Capillary refill takes less than 2 seconds.     Coloration: Skin is not jaundiced or pale.     Findings: No rash.  Neurological:     General: No focal deficit present.     Mental Status: She is alert and oriented to person, place, and time.      UC Treatments / Results  Labs (all labs ordered are listed, but only abnormal results are displayed) Labs Reviewed - No data to display  EKG   Radiology DG Chest 2 View  Result Date: 05/27/2020 CLINICAL DATA:  Productive cough. EXAM: CHEST - 2 VIEW COMPARISON:  None. FINDINGS: The heart size and mediastinal contours are within normal limits. Both lungs are clear. The visualized skeletal structures are unremarkable. IMPRESSION: No  active cardiopulmonary disease. Electronically Signed   By: Marijo Conception M.D.   On: 05/27/2020 13:55    Procedures Procedures (including  critical care time)  Medications Ordered in UC Medications - No data to display  Initial Impression / Assessment and Plan / UC Course  I have reviewed the triage vital signs and the nursing notes.  Pertinent labs & imaging results that were available during my care of the patient were reviewed by me and considered in my medical decision making (see chart for details).     Patient afebrile, nontoxic in office today.  SPO2 96%.  No respiratory distress or airway compromise.  X-ray done office, reviewed by me radiology: Negative for active cardiopulmonary disease.  Reviewed findings with patient verbalized understanding.  Will treat supportively as outlined below, patient follow-up with PCP for further evaluation of chronic bronchitis if needed.  Return precautions discussed, patient verbalized understanding and is agreeable to plan. Final Clinical Impressions(s) / UC Diagnoses   Final diagnoses:  Chronic bronchitis, unspecified chronic bronchitis type [facility])     Discharge Instructions     Stop antibiotic, start prednisone. Follow up with PCP in 1 week    ED Prescriptions    Medication Sig Dispense Auth. Provider   predniSONE (DELTASONE) 50 MG tablet Take 1 tablet (50 mg total) by mouth daily with breakfast. 5 tablet Hall-Potvin, [place], PA-C     PDMP not reviewed this encounter.   Hall-Potvin, [place], [place] 05/27/20 1404

## 2020-05-27 NOTE — Discharge Instructions (Signed)
Stop antibiotic, start prednisone. Follow up with PCP in 1 week

## 2020-05-27 NOTE — ED Triage Notes (Signed)
Pt c/o cough x 2 weeks, was given amoxicillin by video visit, started taking it 3 days ago and is concerned about the "bumps inside my neck"  Not taking anything OTC for cough. Pt states video visit provider prescribed her "a pill for cough" but she did not take

## 2020-09-14 ENCOUNTER — Ambulatory Visit: Payer: 59 | Admitting: Certified Nurse Midwife

## 2020-09-15 NOTE — Progress Notes (Deleted)
54 y.o. G74P1001 Married White or Caucasian female here for annual exam.      Patient's last menstrual period was 11/24/2016.          Sexually active: {yes no:314532}  The current method of family planning is none.    Exercising: {yes DU:202542}  {types:19826} Smoker:  {YES NO:22349}  Health Maintenance: Pap:  12-02-15 neg HPV HR neg, 09-14-2019 neg History of abnormal Pap:  no MMG:  2015 neg per patient Colonoscopy:  none BMD:   none TDaP:  2014 Gardasil:   *** Covid-19: *** Pneumonia vaccine(s):  no Shingrix:   no Hep C testing: hep c neg 2017 Screening Labs: ***   reports that she has been smoking cigarettes. She started smoking about 18 years ago. She has smoked for the past 37.00 years. She has never used smokeless tobacco. She reports current alcohol use of about 7.0 standard drinks of alcohol per week. She reports that she does not use drugs.  Past Medical History:  Diagnosis Date  . Anxiety   . Back injury   . Chicken pox   . Depression   . Endometriosis   . Fibroid    ?  Marland Kitchen Fibromyalgia   . GERD (gastroesophageal reflux disease)   . H/O blood clots    from heavy menstrual cycles. Had shot done to stop bleeding per patient  . Hyperlipidemia   . Hypertension   . Infertility, female   . Migraines   . Osteoarthritis   . PID (pelvic inflammatory disease)   . STD (sexually transmitted disease)    chlamydia  . Thyroid disease     Past Surgical History:  Procedure Laterality Date  . LAPAROSCOPY    . OVARIAN CYST SURGERY      Current Outpatient Medications  Medication Sig Dispense Refill  . benzonatate (TESSALON) 100 MG capsule Take 1 capsule (100 mg total) by mouth every 8 (eight) hours. 21 capsule 0  . DULoxetine (CYMBALTA) 30 MG capsule Take 30 mg by mouth daily.     Marland Kitchen levothyroxine (SYNTHROID) 125 MCG tablet Take 125 mcg by mouth daily before breakfast.    . meloxicam (MOBIC) 15 MG tablet Take 15 mg by mouth daily.    . predniSONE (DELTASONE) 50 MG tablet  Take 1 tablet (50 mg total) by mouth daily with breakfast. 5 tablet 0   No current facility-administered medications for this visit.    Family History  Problem Relation Age of Onset  . Breast cancer Maternal Aunt   . Cancer Maternal Grandfather   . Arthritis Mother   . Hyperlipidemia Mother   . Stroke Mother   . Hypertension Mother   . Diabetes Mother   . Hyperlipidemia Father   . Other Maternal Uncle        tumor  . Leukemia Paternal Uncle     Review of Systems  Exam:   LMP 11/24/2016      General appearance: alert, cooperative and appears stated age Head: Normocephalic, without obvious abnormality, atraumatic Neck: no adenopathy, supple, symmetrical, trachea midline and thyroid {EXAM; THYROID:18604} Lungs: clear to auscultation bilaterally Breasts: {Exam; breast:13139::"normal appearance, no masses or tenderness"} Heart: regular rate and rhythm Abdomen: soft, non-tender; bowel sounds normal; no masses,  no organomegaly Extremities: extremities normal, atraumatic, no cyanosis or edema Skin: Skin color, texture, turgor normal. No rashes or lesions Lymph nodes: Cervical, supraclavicular, and axillary nodes normal. No abnormal inguinal nodes palpated Neurologic: Grossly normal   Pelvic: External genitalia:  no lesions  Urethra:  normal appearing urethra with no masses, tenderness or lesions              Bartholins and Skenes: normal                 Vagina: normal appearing vagina with normal color and discharge, no lesions              Cervix: {exam; cervix:14595}              Pap taken: {yes no:314532} Bimanual Exam:  Uterus:  {exam; uterus:12215}              Adnexa: {exam; adnexa:12223}               Rectovaginal: Confirms               Anus:  normal sphincter tone, no lesions  Chaperone was present for exam.  A:  Well Woman with normal exam  P:   {plan; gyn:5269::"mammogram","pap smear","return annually or prn"}

## 2020-09-16 ENCOUNTER — Ambulatory Visit: Payer: 59 | Admitting: Nurse Practitioner

## 2020-12-15 ENCOUNTER — Other Ambulatory Visit: Payer: Self-pay | Admitting: Internal Medicine

## 2020-12-15 ENCOUNTER — Ambulatory Visit
Admission: RE | Admit: 2020-12-15 | Discharge: 2020-12-15 | Disposition: A | Discharge status: Home / Self Care | Payer: 59 | Source: Ambulatory Visit | Attending: Internal Medicine | Admitting: Internal Medicine

## 2020-12-15 DIAGNOSIS — M7989 Other specified soft tissue disorders: Secondary | ICD-10-CM

## 2020-12-15 DIAGNOSIS — M25542 Pain in joints of left hand: Secondary | ICD-10-CM

## 2020-12-15 DIAGNOSIS — M25541 Pain in joints of right hand: Secondary | ICD-10-CM

## 2021-09-26 ENCOUNTER — Other Ambulatory Visit: Payer: Self-pay | Admitting: Obstetrics and Gynecology

## 2021-09-26 DIAGNOSIS — R928 Other abnormal and inconclusive findings on diagnostic imaging of breast: Secondary | ICD-10-CM

## 2021-12-07 ENCOUNTER — Ambulatory Visit
Admission: RE | Admit: 2021-12-07 | Discharge: 2021-12-07 | Disposition: A | Discharge status: Home / Self Care | Payer: 59 | Source: Ambulatory Visit | Attending: Obstetrics and Gynecology | Admitting: Obstetrics and Gynecology

## 2021-12-07 ENCOUNTER — Other Ambulatory Visit: Payer: Self-pay | Admitting: Obstetrics and Gynecology

## 2021-12-07 DIAGNOSIS — R928 Other abnormal and inconclusive findings on diagnostic imaging of breast: Secondary | ICD-10-CM

## 2021-12-07 DIAGNOSIS — N6489 Other specified disorders of breast: Secondary | ICD-10-CM

## 2022-01-12 ENCOUNTER — Ambulatory Visit
Admission: EM | Admit: 2022-01-12 | Discharge: 2022-01-12 | Disposition: A | Discharge status: Home / Self Care | Payer: 59 | Attending: Physician Assistant | Admitting: Physician Assistant

## 2022-01-12 ENCOUNTER — Other Ambulatory Visit: Payer: Self-pay

## 2022-01-12 DIAGNOSIS — H9201 Otalgia, right ear: Secondary | ICD-10-CM | POA: Diagnosis not present

## 2022-01-12 DIAGNOSIS — H6981 Other specified disorders of Eustachian tube, right ear: Secondary | ICD-10-CM

## 2022-01-12 MED ORDER — AMOXICILLIN-POT CLAVULANATE 875-125 MG PO TABS: 1.0000 | ORAL_TABLET | Freq: Two times a day (BID) | ORAL | 0 refills | Status: AC

## 2022-01-12 MED ORDER — FLUTICASONE PROPIONATE 50 MCG/ACT NA SUSP: 1.0000 | Freq: Every day | NASAL | 0 refills | Status: AC

## 2022-01-12 NOTE — ED Provider Notes (Signed)
?[facility] ? ? ? ?CSN: [id number] ?Arrival date & time: 01/12/22  9675 ? ? ?  ? ?History   ?Chief Complaint ?Chief Complaint  ?Patient presents with  ? Otalgia  ?  right  ? ? ?HPI ?Cynthia Wolf is a 56 y.o. female.  ? ?Patient here today for evaluation of right ear pain and right upper jaw pain that started yesterday. She notes some pain in her right molars as well. She reports she recently had similar symptoms and was prescribed augmentin  but she did not finish course because symptoms had improved. She has not had any fever. She reports she does have worsened pain with swallowing. She denies pain in her left ear. She did take one left over dose of augmentin last night without resolution of symptoms. ? ?The history is provided by the patient.  ?Otalgia ?Associated symptoms: no abdominal pain, no congestion, no cough, no diarrhea, no fever, no sore throat and no vomiting   ? ?Past Medical History:  ?Diagnosis Date  ? Anxiety   ? Back injury   ? Chicken pox   ? Depression   ? Endometriosis   ? Fibroid   ? ?  ? Fibromyalgia   ? GERD (gastroesophageal reflux disease)   ? H/O blood clots   ? from heavy menstrual cycles. Had shot done to stop bleeding per patient  ? Hyperlipidemia   ? Hypertension   ? Infertility, female   ? Migraines   ? Osteoarthritis   ? PID (pelvic inflammatory disease)   ? STD (sexually transmitted disease)   ? chlamydia  ? Thyroid disease   ? ? ?Patient Active Problem List  ? Diagnosis Date Noted  ? Numbness and tingling in left arm 09/17/2016  ? Visit for screening mammogram 04/17/2016  ? Food allergy 04/17/2016  ? Left thyroid nodule 04/17/2016  ? Hypothyroidism 01/02/2016  ? Fibromyalgia 01/02/2016  ? Essential hypertension 01/02/2016  ? Anxiety and depression 01/02/2016  ? ? ?Past Surgical History:  ?Procedure Laterality Date  ? LAPAROSCOPY    ? OVARIAN CYST SURGERY    ? ? ?OB History   ? ? Gravida  ?1  ? Para  ?1  ? Term  ?1  ? Preterm  ?   ? AB  ?   ? Living  ?1  ?  ? ? SAB  ?    ? IAB  ?   ? Ectopic  ?   ? Multiple  ?   ? Live Births  ?   ?   ?  ?  ? ? ? ?Home Medications   ? ?Prior to Admission medications   ?Medication Sig Start Date End Date Taking? Authorizing Provider  ?amoxicillin-clavulanate (AUGMENTIN) 875-125 MG tablet Take 1 tablet by mouth every 12 (twelve) hours. 01/12/22  Yes Francene Finders, PA-C  ?fluticasone (FLONASE) 50 MCG/ACT nasal spray Place 1 spray into both nostrils daily. 01/12/22  Yes Francene Finders, PA-C  ?benzonatate (TESSALON) 100 MG capsule Take 1 capsule (100 mg total) by mouth every 8 (eight) hours. 05/27/20   Hall-Potvin, [place], PA-C  ?DULoxetine (CYMBALTA) 30 MG capsule Take 30 mg by mouth daily.  05/01/19   [provider]  ?levothyroxine (SYNTHROID) 125 MCG tablet Take 125 mcg by mouth daily before breakfast.    [provider]  ?meloxicam (MOBIC) 15 MG tablet Take 15 mg by mouth daily. 03/25/20   [provider]  ?predniSONE (DELTASONE) 50 MG tablet Take 1 tablet (50 mg total) by  mouth daily with breakfast. 05/27/20   Hall-Potvin, [place], PA-C  ? ? ?Family History ?Family History  ?Problem Relation Age of Onset  ? Breast cancer Maternal Aunt   ? Cancer Maternal Grandfather   ? Arthritis Mother   ? Hyperlipidemia Mother   ? Stroke Mother   ? Hypertension Mother   ? Diabetes Mother   ? Hyperlipidemia Father   ? Other Maternal Uncle   ?     tumor  ? Leukemia Paternal Uncle   ? ? ?Social History ?Social History  ? ?Tobacco Use  ? Smoking status: Every Day  ?  Years: 37.00  ?  Types: Cigarettes  ?  Start date: 04/17/2002  ? Smokeless tobacco: Never  ?Substance Use Topics  ? Alcohol use: Yes  ?  Alcohol/week: 7.0 standard drinks  ?  Types: 7 Standard drinks or equivalent per week  ?  Comment: socially  ? Drug use: No  ? ? ? ?Allergies   ?Bactrim [sulfamethoxazole-trimethoprim], Shellfish allergy, and Yellow dyes (non-tartrazine) ? ? ?Review of Systems ?Review of Systems  ?Constitutional:  Negative for chills and fever.  ?HENT:   Positive for ear pain. Negative for congestion and sore throat.   ?Eyes:  Negative for discharge and redness.  ?Respiratory:  Negative for cough, shortness of breath and wheezing.   ?Gastrointestinal:  Negative for abdominal pain, diarrhea, nausea and vomiting.  ? ? ?Physical Exam ?Triage Vital Signs ?ED Triage Vitals  ?Enc Vitals Group  ?   BP 01/12/22 0951 (!) 154/93  ?   Pulse Rate 01/12/22 0951 93  ?   Resp 01/12/22 0951 18  ?   Temp 01/12/22 0951 98.1 ?F (36.7 ?C)  ?   Temp Source 01/12/22 0951 Oral  ?   SpO2 01/12/22 0951 95 %  ?   Weight --   ?   Height --   ?   Head Circumference --   ?   Peak Flow --   ?   Pain Score 01/12/22 0954 10  ?   Pain Loc --   ?   Pain Edu? --   ?   Excl. in [place]? --   ? ?No data found. ? ?Updated Vital Signs ?BP (!) 154/93 (BP Location: Left Arm)   Pulse 93   Temp 98.1 ?F (36.7 ?C) (Oral)   Resp 18   LMP 11/24/2016   SpO2 95%  ?   ? ?Physical Exam ?Vitals and nursing note reviewed.  ?Constitutional:   ?   General: She is not in acute distress. ?   Appearance: Normal appearance. She is not ill-appearing.  ?HENT:  ?   Head: Normocephalic and atraumatic.  ?   Left Ear: Tympanic membrane normal.  ?   Ears:  ?   Comments: Right TM retracted, mildly erythematous ?   Nose: Congestion present.  ?Eyes:  ?   Conjunctiva/sclera: Conjunctivae normal.  ?Cardiovascular:  ?   Rate and Rhythm: Normal rate and regular rhythm.  ?   Heart sounds: Normal heart sounds. No murmur heard. ?Pulmonary:  ?   Effort: Pulmonary effort is normal. No respiratory distress.  ?   Breath sounds: Normal breath sounds. No wheezing, rhonchi or rales.  ?Skin: ?   General: Skin is warm and dry.  ?Neurological:  ?   Mental Status: She is alert.  ?Psychiatric:     ?   Mood and Affect: Mood normal.     ?   Thought Content: Thought content normal.  ? ? ? ?  UC Treatments / Results  ?Labs ?(all labs ordered are listed, but only abnormal results are displayed) ?Labs Reviewed - No data to display ? ?EKG ? ? ?Radiology ?No  results found. ? ?Procedures ?Procedures (including critical care time) ? ?Medications Ordered in UC ?Medications - No data to display ? ?Initial Impression / Assessment and Plan / UC Course  ?I have reviewed the triage vital signs and the nursing notes. ? ?Pertinent labs & imaging results that were available during my care of the patient were reviewed by me and considered in my medical decision making (see chart for details). ? ?  ?Augmentin refilled to cover possible bacterial etiology of symptoms given she did not complete last regimen. Recommended flonase as well to hopefully decrease any ETD. Recommended follow up if symptoms fail to improve or worsen.  ? ?Final Clinical Impressions(s) / UC Diagnoses  ? ?Final diagnoses:  ?Right ear pain  ?Dysfunction of right eustachian tube  ? ?Discharge Instructions   ?None ?  ? ?ED Prescriptions   ? ? Medication Sig Dispense Auth. Provider  ? amoxicillin-clavulanate (AUGMENTIN) 875-125 MG tablet Take 1 tablet by mouth every 12 (twelve) hours. 14 tablet Ewell Poe F, PA-C  ? fluticasone (FLONASE) 50 MCG/ACT nasal spray Place 1 spray into both nostrils daily. 16 g Francene Finders, PA-C  ? ?  ? ?PDMP not reviewed this encounter. ?  ?Francene Finders, PA-C ?01/12/22 1036 ? ?

## 2022-01-12 NOTE — ED Triage Notes (Signed)
Onset yesterday of "throbbing" right ear pain and right upper jaw pain causing pain with swallowing. Pt notes that she can feel some water in her ear.  ?Has taken one dose of leftover augmentin without relief. No left ear sxs. Denies uri sxs. Also tried oragel w/o relief. ?

## 2022-08-23 ENCOUNTER — Ambulatory Visit: Payer: 59 | Admitting: Internal Medicine

## 2023-10-23 IMAGING — MG DIGITAL DIAGNOSTIC BILAT W/ TOMO W/ CAD
6 of 10 series · 6 of 30 positions shown · non-contrast
Comparison: Baseline mammogram 09/06/2021

CLINICAL DATA: Patient returns after screening study for evaluation
of possible asymmetry in the RIGHT breast and possible mass in the
LEFT breast.



[L MLO synth-2D (1 of 2)]
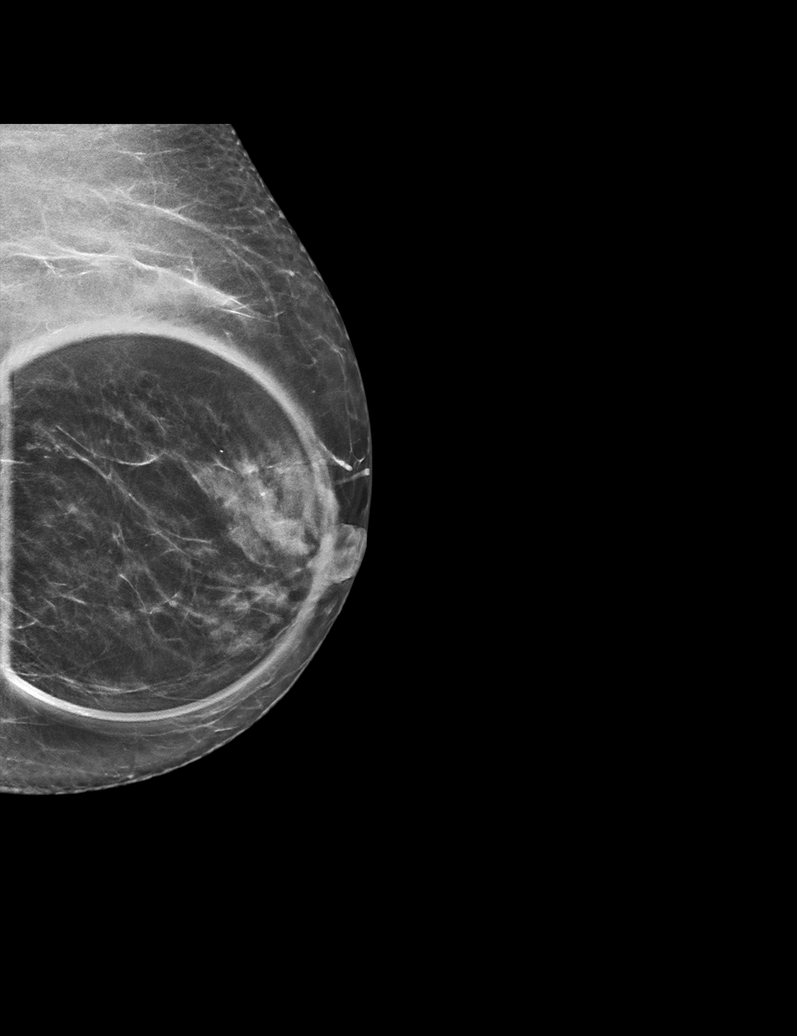

[R ML synth-2D]
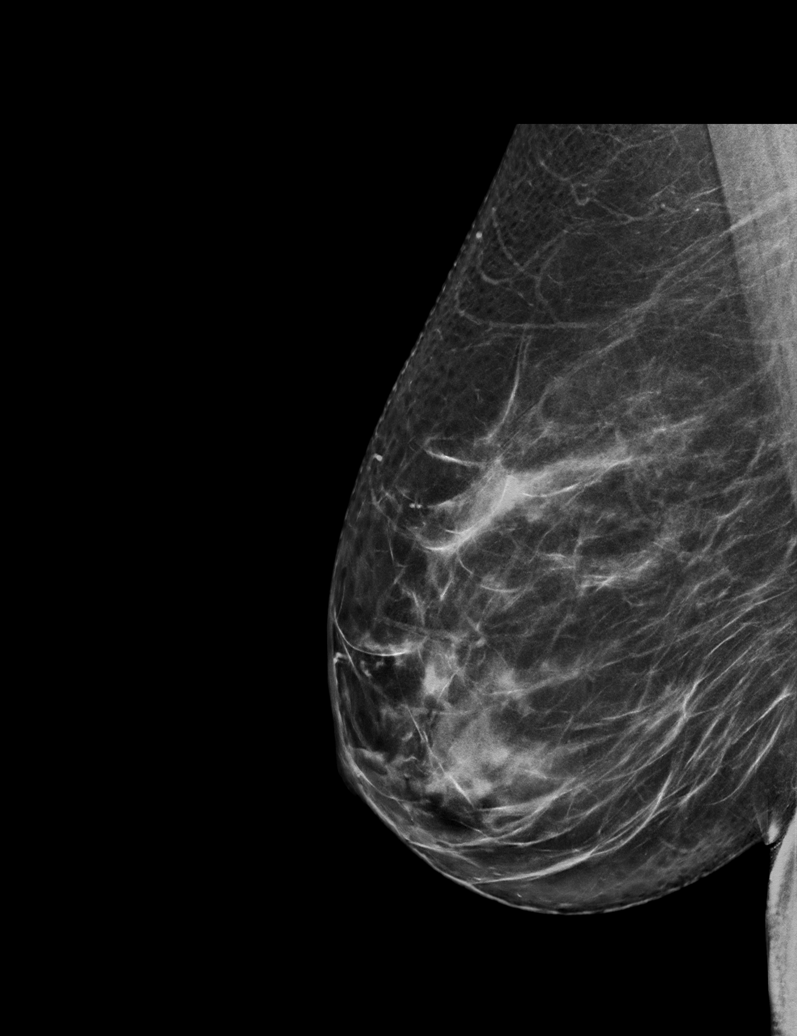

[L MLO synth-2D (2 of 2)]
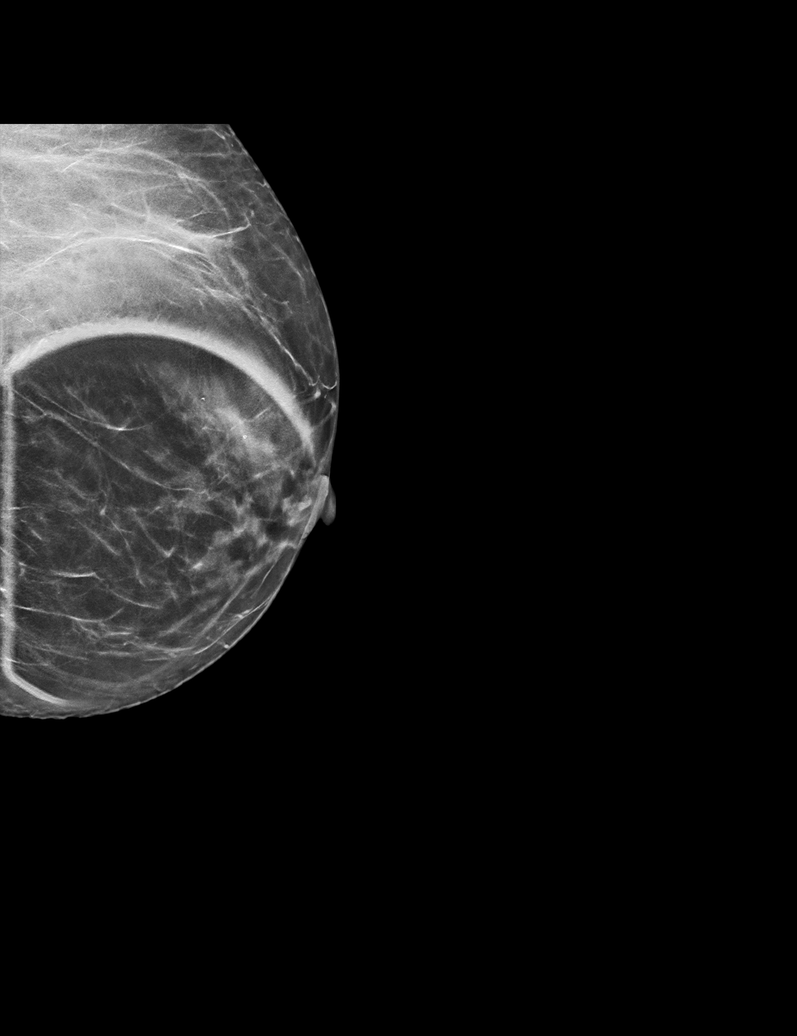

[R CC synth-2D]
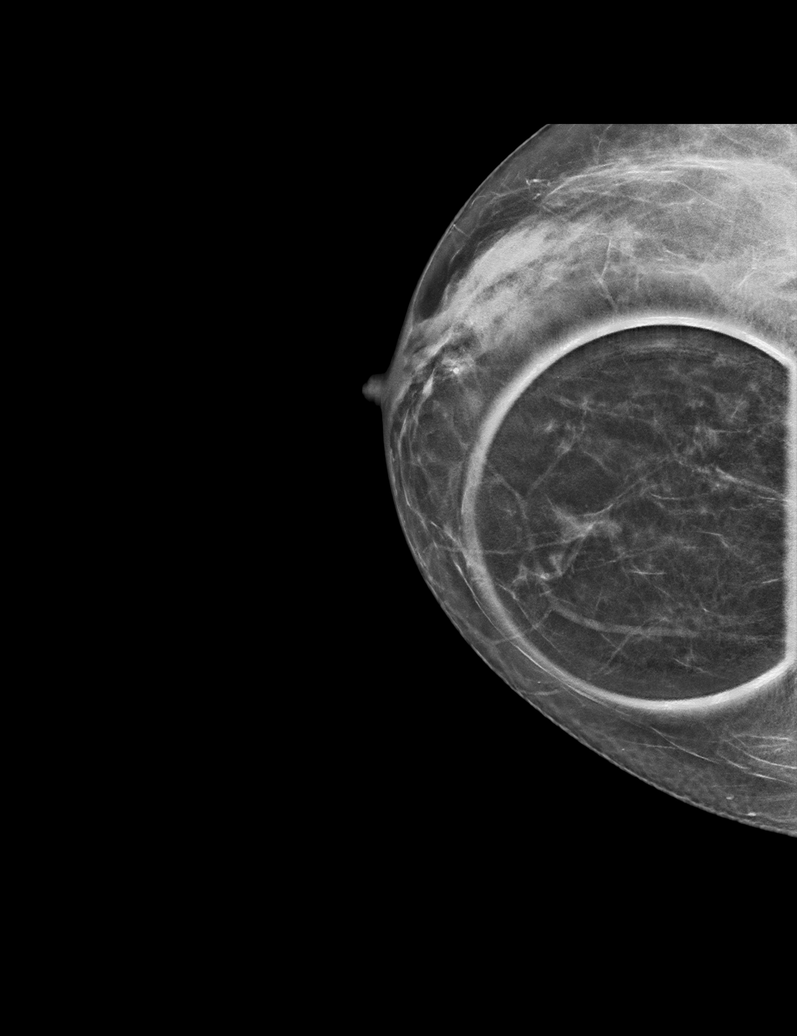

[L CC synth-2D]
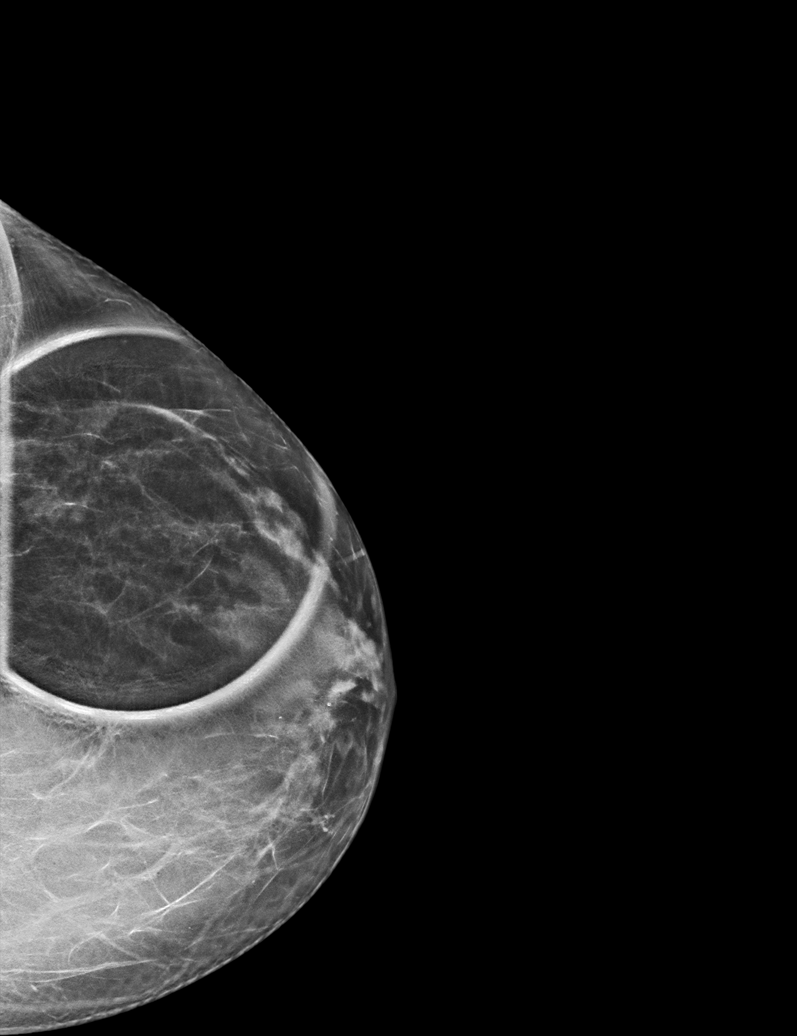

[L MLO tomo · tomo slice 27/54.0]
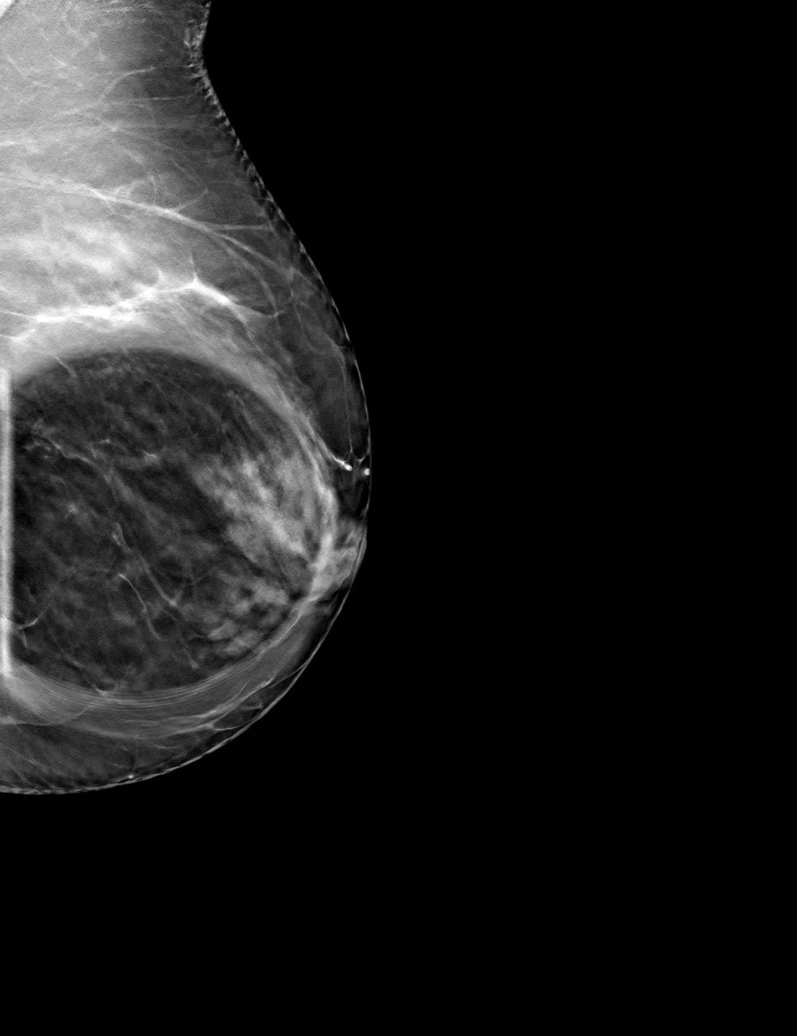

[6 of 30 positions shown; findings below may reference images not displayed]

ACR Breast Density Category c: The breast tissue is heterogeneously
dense, which may obscure small masses.
FINDINGS: RIGHT BREAST:

Mammogram: Additional 2-D and 3-D images are performed. These views
confirms presence of an irregular mass in the MEDIAL portion of the
RIGHT breast, best seen on craniocaudal projection. Mammographic
images were processed with CAD.

Ultrasound: Targeted ultrasound is performed, showing an irregular
mass with indistinct margins in the 1 o'clock location of the RIGHT
breast 3 centimeters from the nipple. There is an associated
irregular echogenic rind. Mass measures 1.1 x 0.7 centimeters and is
associated posterior acoustic shadowing. No definitive internal
blood flow on Doppler evaluation.

Evaluation of the RIGHT axilla is negative for adenopathy.

LEFT BREAST:

Mammogram: Additional 2-D and 3-D images are performed. These views
confirm presence of an oval circumscribed mass in the LATERAL
portion of the LEFT breast and further evaluated with ultrasound.
Mammographic images were processed with CAD.

Ultrasound: Targeted ultrasound is performed, showing a mildly
dilated duct in the 3-4 o'clock location of the LEFT breast 3
centimeters from the nipple. No intraductal mass identified. This
mildly prominent duct correlates well with the mammographic
appearance.
IMPRESSION: 1. Indeterminate mass in the RIGHT breast 1 o'clock location for
which biopsy is recommended.
2. No RIGHT axillary adenopathy.
3. LEFT breast is negative.

RECOMMENDATION:
Ultrasound-guided core biopsy of RIGHT breast mass.

I have discussed the findings and recommendations with the patient.
If applicable, a reminder letter will be sent to the patient
regarding the next appointment.

BI-RADS CATEGORY  4: Suspicious.
